# Patient Record
Sex: Female | Born: 1940 | Race: White | Hispanic: No | Smoking: Never smoker
Health system: Southern US, Community
[De-identification: ages and names within clinical notes are randomized; demographics above are authoritative.]

## PROBLEM LIST (undated history)

## (undated) DIAGNOSIS — F0390 Unspecified dementia without behavioral disturbance: Secondary | ICD-10-CM

---

## 2016-09-22 DIAGNOSIS — I2510 Atherosclerotic heart disease of native coronary artery without angina pectoris: Secondary | ICD-10-CM | POA: Diagnosis not present

## 2016-09-22 DIAGNOSIS — I10 Essential (primary) hypertension: Secondary | ICD-10-CM | POA: Diagnosis not present

## 2016-09-22 DIAGNOSIS — E039 Hypothyroidism, unspecified: Secondary | ICD-10-CM | POA: Diagnosis not present

## 2016-09-22 DIAGNOSIS — E871 Hypo-osmolality and hyponatremia: Secondary | ICD-10-CM

## 2016-09-22 DIAGNOSIS — F0390 Unspecified dementia without behavioral disturbance: Secondary | ICD-10-CM | POA: Diagnosis not present

## 2016-09-22 DIAGNOSIS — E785 Hyperlipidemia, unspecified: Secondary | ICD-10-CM

## 2016-09-23 DIAGNOSIS — I10 Essential (primary) hypertension: Secondary | ICD-10-CM | POA: Diagnosis not present

## 2016-09-23 DIAGNOSIS — F0390 Unspecified dementia without behavioral disturbance: Secondary | ICD-10-CM | POA: Diagnosis not present

## 2016-09-23 DIAGNOSIS — E039 Hypothyroidism, unspecified: Secondary | ICD-10-CM | POA: Diagnosis not present

## 2016-09-23 DIAGNOSIS — I2510 Atherosclerotic heart disease of native coronary artery without angina pectoris: Secondary | ICD-10-CM | POA: Diagnosis not present

## 2016-09-24 DIAGNOSIS — I10 Essential (primary) hypertension: Secondary | ICD-10-CM | POA: Diagnosis not present

## 2016-09-24 DIAGNOSIS — E039 Hypothyroidism, unspecified: Secondary | ICD-10-CM | POA: Diagnosis not present

## 2016-09-24 DIAGNOSIS — I2510 Atherosclerotic heart disease of native coronary artery without angina pectoris: Secondary | ICD-10-CM | POA: Diagnosis not present

## 2016-09-24 DIAGNOSIS — F0390 Unspecified dementia without behavioral disturbance: Secondary | ICD-10-CM | POA: Diagnosis not present

## 2018-08-02 DIAGNOSIS — R55 Syncope and collapse: Secondary | ICD-10-CM | POA: Diagnosis not present

## 2018-08-02 DIAGNOSIS — R112 Nausea with vomiting, unspecified: Secondary | ICD-10-CM

## 2018-08-02 DIAGNOSIS — I959 Hypotension, unspecified: Secondary | ICD-10-CM

## 2018-08-02 DIAGNOSIS — E785 Hyperlipidemia, unspecified: Secondary | ICD-10-CM

## 2018-08-02 DIAGNOSIS — E039 Hypothyroidism, unspecified: Secondary | ICD-10-CM

## 2018-08-02 DIAGNOSIS — I2510 Atherosclerotic heart disease of native coronary artery without angina pectoris: Secondary | ICD-10-CM | POA: Diagnosis not present

## 2018-08-02 DIAGNOSIS — F0390 Unspecified dementia without behavioral disturbance: Secondary | ICD-10-CM

## 2018-08-03 DIAGNOSIS — R55 Syncope and collapse: Secondary | ICD-10-CM | POA: Diagnosis not present

## 2018-08-04 DIAGNOSIS — R55 Syncope and collapse: Secondary | ICD-10-CM | POA: Diagnosis not present

## 2018-08-04 DIAGNOSIS — R531 Weakness: Secondary | ICD-10-CM

## 2020-04-14 ENCOUNTER — Ambulatory Visit: Payer: Self-pay | Admitting: Osteopathic Medicine

## 2020-09-25 ENCOUNTER — Encounter (HOSPITAL_COMMUNITY): Payer: Self-pay | Admitting: Emergency Medicine

## 2020-09-25 ENCOUNTER — Emergency Department (HOSPITAL_COMMUNITY)
Admission: EM | Admit: 2020-09-25 | Discharge: 2020-09-26 | Disposition: A | Discharge status: Home / Self Care | Payer: Medicare (Managed Care) | Attending: Emergency Medicine | Admitting: Emergency Medicine

## 2020-09-25 ENCOUNTER — Emergency Department (HOSPITAL_COMMUNITY): Payer: Medicare (Managed Care)

## 2020-09-25 ENCOUNTER — Other Ambulatory Visit: Payer: Self-pay

## 2020-09-25 DIAGNOSIS — F0390 Unspecified dementia without behavioral disturbance: Secondary | ICD-10-CM | POA: Diagnosis not present

## 2020-09-25 DIAGNOSIS — R031 Nonspecific low blood-pressure reading: Secondary | ICD-10-CM | POA: Insufficient documentation

## 2020-09-25 DIAGNOSIS — I951 Orthostatic hypotension: Secondary | ICD-10-CM

## 2020-09-25 DIAGNOSIS — R55 Syncope and collapse: Secondary | ICD-10-CM | POA: Diagnosis present

## 2020-09-25 HISTORY — DX: Unspecified dementia, unspecified severity, without behavioral disturbance, psychotic disturbance, mood disturbance, and anxiety: F03.90

## 2020-09-25 LAB — CBC WITH DIFFERENTIAL/PLATELET
Abs Immature Granulocytes: 0.05 10*3/uL (ref 0.00–0.07)
Basophils Absolute: 0.1 10*3/uL (ref 0.0–0.1)
Basophils Relative: 0 %
Eosinophils Absolute: 0.1 10*3/uL (ref 0.0–0.5)
Eosinophils Relative: 1 %
HCT: 37.4 % (ref 36.0–46.0)
Hemoglobin: 12.3 g/dL (ref 12.0–15.0)
Immature Granulocytes: 0 %
Lymphocytes Relative: 6 %
Lymphs Abs: 0.8 10*3/uL (ref 0.7–4.0)
MCH: 31.6 pg (ref 26.0–34.0)
MCHC: 32.9 g/dL (ref 30.0–36.0)
MCV: 96.1 fL (ref 80.0–100.0)
Monocytes Absolute: 0.9 10*3/uL (ref 0.1–1.0)
Monocytes Relative: 7 %
Neutro Abs: 11.1 10*3/uL — ABNORMAL HIGH (ref 1.7–7.7)
Neutrophils Relative %: 86 %
Platelets: 342 10*3/uL (ref 150–400)
RBC: 3.89 MIL/uL (ref 3.87–5.11)
RDW: 12.9 % (ref 11.5–15.5)
WBC: 13.1 10*3/uL — ABNORMAL HIGH (ref 4.0–10.5)
nRBC: 0 % (ref 0.0–0.2)

## 2020-09-25 LAB — BASIC METABOLIC PANEL
Anion gap: 10 (ref 5–15)
BUN: 12 mg/dL (ref 8–23)
CO2: 21 mmol/L — ABNORMAL LOW (ref 22–32)
Calcium: 8.4 mg/dL — ABNORMAL LOW (ref 8.9–10.3)
Chloride: 101 mmol/L (ref 98–111)
Creatinine, Ser: 1.2 mg/dL — ABNORMAL HIGH (ref 0.44–1.00)
GFR, Estimated: 46 mL/min — ABNORMAL LOW (ref >60)
Glucose, Bld: 142 mg/dL — ABNORMAL HIGH (ref 70–99)
Potassium: 4.1 mmol/L (ref 3.5–5.1)
Sodium: 132 mmol/L — ABNORMAL LOW (ref 135–145)

## 2020-09-25 LAB — TROPONIN I (HIGH SENSITIVITY): Troponin I (High Sensitivity): 4 ng/L (ref <18)

## 2020-09-25 NOTE — ED Provider Notes (Signed)
[facility] EMERGENCY DEPARTMENT Provider Note   CSN: [id number] Arrival date & time: 09/25/20  2204     History Chief Complaint  Patient presents with  . Near Syncope    Faith White Otie Headlee is a 79 y.o. female.  The history is provided by the patient, medical records, the EMS personnel and a relative.    LEVEL V CAVEAT:  DEMENTIA  79 y.o. F presenting to the ED after near syncopal event.  Per EMS, patient was orthostatic with EMS.  Initial BP 80's systolic, dropped into 60's systolic upon standing.  She was given 600cc IVF en route.  Patient without complaints here, states she feels fine.  Denies chest pain, SOB, or recent illness.  Patient not able to answer a lot of questions, replies "I couldn't tell you" to most things.  Spoke with patient's son Rayland who is POA.  Patient also lives with him and wife.  Reportedly tonight was with granddaughter and seemed fine. Patient had gotten out of the shower and went to eat and stood up from table and she almost passed out.  There was no fall to the floor or head trauma as granddaughter was able to catch her.  Patient has had very poor PO intake recently.  Had similar episode about 1 week ago but not as severe.  Initial BP 80/40 when EMS arrived.  No recent changes in BP medications in the past year or so per son.  No fevers or other recent illness.  Did have mild heart attack 10 years ago but no other issues since then.  No past medical history on file.  There are no problems to display for this patient.    OB History   No obstetric history on file.     No family history on file.  Social History   Tobacco Use  . Smoking status: Not on file  Substance Use Topics  . Alcohol use: Not on file  . Drug use: Not on file    Home Medications Prior to Admission medications   Not on File    Allergies    Patient has no allergy information on record.  Review of Systems   Review of Systems  Unable to  perform ROS: Dementia    Physical Exam Updated Vital Signs There were no vitals taken for this visit.  Physical Exam Vitals and nursing note reviewed.  Constitutional:      Appearance: She is well-developed.  HENT:     Head: Normocephalic and atraumatic.  Eyes:     Conjunctiva/sclera: Conjunctivae normal.     Pupils: Pupils are equal, round, and reactive to light.  Cardiovascular:     Rate and Rhythm: Normal rate and regular rhythm.     Heart sounds: Normal heart sounds.  Pulmonary:     Effort: Pulmonary effort is normal.     Breath sounds: Normal breath sounds. No stridor. No wheezing.  Abdominal:     General: Bowel sounds are normal.     Palpations: Abdomen is soft.     Tenderness: There is no abdominal tenderness. There is no rebound.  Musculoskeletal:        General: Normal range of motion.     Cervical back: Normal range of motion.  Skin:    General: Skin is warm and dry.  Neurological:     Mental Status: She is alert and oriented to person, place, and time.     ED Results / Procedures / Treatments  Labs (all labs ordered are listed, but only abnormal results are displayed) Labs Reviewed - No data to display  EKG None  Radiology DG Chest [facility] 1 View  Result Date: 09/25/2020 CLINICAL DATA:  Syncope EXAM: PORTABLE CHEST 1 VIEW COMPARISON:  Radiograph 01/30/2019 FINDINGS: Chronically coarsened interstitial changes and bronchitic features are similar to comparison exams accounting for some mild atelectatic change. No consolidative opacity. No pneumothorax or effusion. Some bandlike areas of scarring are present in the right mid lung. Additional biapical pleuroparenchymal scarring as well. The aorta is calcified. The remaining cardiomediastinal contours are unremarkable. No acute osseous or soft tissue abnormality. Degenerative changes are present in the imaged spine and shoulders. Telemetry leads overlie the chest. IMPRESSION: 1. Chronically coarsened interstitial  changes and bronchitic features are similar to comparison exams accounting for some mild atelectatic change. 2. No other acute cardiopulmonary abnormality. 3.  Aortic Atherosclerosis (ICD10-I70.0). Electronically Signed   By: Kreg Shropshire M.D.   On: 09/25/2020 23:06    Procedures Procedures (including critical care time)  Medications Ordered in ED Medications - No data to display  ED Course  I have reviewed the triage vital signs and the nursing notes.  Pertinent labs & imaging results that were available during my care of the patient were reviewed by me and considered in my medical decision making (see chart for details).    MDM Rules/Calculators/A&P  79 year old female presenting to the ED following near syncopal event. EMS reported + orthostasis at scene. Patient has baseline dementia and is not able to provide much additional history.  She denies any chest pain, shortness of breath, headache, or recent illness.  I did speak with her son whom she lives with and is also POA-- has had poor appetite recently with decreased PO intake.  No recent illness and has otherwise been at baseline.  She is afebrile, non-toxic in appearance here.  HD stable at present after IVF with EMS.  Will obtain screening labs, trop, UA, CXR, and EKG.    Work-up overall reassuring.  Labs without significant electrolyte derangement or acute dehydration.  Troponins negative.  EKG is nonischemic.  Chest x-ray with chronic interstitial changes, no acute infiltrate or other abnormality.  UA without signs of infection.  Orthostatics are appropriate after IVF with EMS.  Myself and RN have ambulated patient, steady gait observed.  Patient remains without complaints.  On chart review, patient with similar visit to [facility] last year with almost identical presentation.  Feel she is stable for discharge home.  Have called son who is POA and discussed findings today, he is also comfortable with plan to discharge home.  Close follow-up  with PCP encouraged-- recommended to try to increase PO intake.  Return here for any new/acute changes.    Final Clinical Impression(s) / ED Diagnoses Final diagnoses:  Near syncope  Orthostatic hypotension    Rx / DC Orders ED Discharge Orders    None       Garlon Hatchet, PA-C 09/26/20 0225    Tegeler, Canary Brim, MD 09/28/20 [phone]

## 2020-09-25 NOTE — ED Triage Notes (Signed)
Pt BIB [facility] from home, family reports witnessed near syncopal event. On standing from the table pt became unstable and her "eyes rolled back", EMS reports family caught pt before she fell. On EMS arrival pt hypotensive with SBP 80s, dropping to 60s on standing. Pt given 600cc Peever pta, pt A&O to baseline, hx dementia.

## 2020-09-26 LAB — URINALYSIS, ROUTINE W REFLEX MICROSCOPIC
Bilirubin Urine: NEGATIVE
Glucose, UA: NEGATIVE mg/dL
Hgb urine dipstick: NEGATIVE
Ketones, ur: NEGATIVE mg/dL
Leukocytes,Ua: NEGATIVE
Nitrite: NEGATIVE
Protein, ur: NEGATIVE mg/dL
Specific Gravity, Urine: 1.008 (ref 1.005–1.030)
pH: 6 (ref 5.0–8.0)

## 2020-09-26 NOTE — Discharge Instructions (Signed)
Please make sure to eat/drink regularly. Follow-up with your primary care doctor. Return here for any new/acute changes.

## 2020-09-27 LAB — URINE CULTURE: Culture: NO GROWTH

## 2020-10-05 ENCOUNTER — Emergency Department (HOSPITAL_COMMUNITY)
Admission: EM | Admit: 2020-10-05 | Discharge: 2020-10-06 | Disposition: A | Discharge status: Home / Self Care | Payer: Medicare (Managed Care) | Attending: Emergency Medicine | Admitting: Emergency Medicine

## 2020-10-05 ENCOUNTER — Encounter (HOSPITAL_COMMUNITY): Payer: Self-pay

## 2020-10-05 ENCOUNTER — Emergency Department (HOSPITAL_COMMUNITY): Payer: Medicare (Managed Care)

## 2020-10-05 DIAGNOSIS — F0391 Unspecified dementia with behavioral disturbance: Secondary | ICD-10-CM | POA: Insufficient documentation

## 2020-10-05 DIAGNOSIS — Z79899 Other long term (current) drug therapy: Secondary | ICD-10-CM | POA: Diagnosis not present

## 2020-10-05 DIAGNOSIS — F69 Unspecified disorder of adult personality and behavior: Secondary | ICD-10-CM

## 2020-10-05 LAB — COMPREHENSIVE METABOLIC PANEL
ALT: 30 U/L (ref 0–44)
AST: 30 U/L (ref 15–41)
Albumin: 4.2 g/dL (ref 3.5–5.0)
Alkaline Phosphatase: 49 U/L (ref 38–126)
Anion gap: 12 (ref 5–15)
BUN: 31 mg/dL — ABNORMAL HIGH (ref 8–23)
CO2: 23 mmol/L (ref 22–32)
Calcium: 9 mg/dL (ref 8.9–10.3)
Chloride: 101 mmol/L (ref 98–111)
Creatinine, Ser: 0.8 mg/dL (ref 0.44–1.00)
GFR, Estimated: >60 mL/min (ref >60)
Glucose, Bld: 129 mg/dL — ABNORMAL HIGH (ref 70–99)
Potassium: 4.1 mmol/L (ref 3.5–5.1)
Sodium: 136 mmol/L (ref 135–145)
Total Bilirubin: 0.4 mg/dL (ref 0.3–1.2)
Total Protein: 7.4 g/dL (ref 6.5–8.1)

## 2020-10-05 LAB — CBC
HCT: 36.9 % (ref 36.0–46.0)
Hemoglobin: 12.2 g/dL (ref 12.0–15.0)
MCH: 31.3 pg (ref 26.0–34.0)
MCHC: 33.1 g/dL (ref 30.0–36.0)
MCV: 94.6 fL (ref 80.0–100.0)
Platelets: 371 10*3/uL (ref 150–400)
RBC: 3.9 MIL/uL (ref 3.87–5.11)
RDW: 13.2 % (ref 11.5–15.5)
WBC: 12.3 10*3/uL — ABNORMAL HIGH (ref 4.0–10.5)
nRBC: 0 % (ref 0.0–0.2)

## 2020-10-05 LAB — ACETAMINOPHEN LEVEL: Acetaminophen (Tylenol), Serum: <10 ug/mL — ABNORMAL LOW (ref 10–30)

## 2020-10-05 LAB — RAPID URINE DRUG SCREEN, HOSP PERFORMED
Amphetamines: NOT DETECTED
Barbiturates: NOT DETECTED
Benzodiazepines: NOT DETECTED
Cocaine: NOT DETECTED
Opiates: NOT DETECTED
Tetrahydrocannabinol: NOT DETECTED

## 2020-10-05 LAB — SALICYLATE LEVEL: Salicylate Lvl: <7 mg/dL — ABNORMAL LOW (ref 7.0–30.0)

## 2020-10-05 LAB — ETHANOL: Alcohol, Ethyl (B): <10 mg/dL (ref <10)

## 2020-10-05 NOTE — ED Provider Notes (Addendum)
[facility]-EMERGENCY DEPT Provider Note   CSN: [id number] Arrival date & time: 10/05/20  2018     History No chief complaint on file.   Faith White is a 79 y.o. female.   Altered Mental Status Presenting symptoms: behavior changes, combativeness and confusion   Severity:  Moderate Most recent episode:  More than 2 days ago Episode history:  Multiple Timing:  Constant Progression:  Worsening Chronicity:  Chronic Context: dementia   Associated symptoms: no fever        Past Medical History:  Diagnosis Date   Dementia (HCC)     There are no problems to display for this patient.   History reviewed. No pertinent surgical history.   OB History   No obstetric history on file.     History reviewed. No pertinent family history.  Social History   Tobacco Use   Smoking status: Never Smoker   Smokeless tobacco: Never Used  Substance Use Topics   Alcohol use: Not Currently   Drug use: Never    Home Medications Prior to Admission medications   Medication Sig Start Date End Date Taking? Authorizing Provider  carvedilol (COREG) 3.125 MG tablet Take 3.125 mg by mouth 2 (two) times daily with a meal.   Yes [provider]  levothyroxine (SYNTHROID) 50 MCG tablet Take 50 mcg by mouth daily before breakfast.   Yes [provider]  lisinopril (ZESTRIL) 5 MG tablet Take 5 mg by mouth daily.   Yes [provider]  melatonin 5 MG TABS Take 5 mg by mouth at bedtime.   Yes [provider]  memantine (NAMENDA) 10 MG tablet Take 10 mg by mouth 2 (two) times daily.   Yes [provider]  metoCLOPramide (REGLAN) 5 MG tablet Take 5 mg by mouth in the morning and at bedtime.   Yes [provider]  pantoprazole (PROTONIX) 40 MG tablet Take 40 mg by mouth daily.   Yes [provider]    Allergies    Patient has no known allergies.  Review of Systems   Review of Systems  Unable  to perform ROS: Dementia  Constitutional: Negative for fever.  Psychiatric/Behavioral: Positive for behavioral problems and confusion.    Physical Exam Updated Vital Signs BP (!) 150/87 (BP Location: Left Arm)   Pulse 93   Temp 97.9 F (36.6 C) (Oral)   Resp 17   SpO2 93%   Physical Exam Vitals and nursing note reviewed. Exam conducted with a chaperone present.  Constitutional:      General: She is not in acute distress.    Appearance: Normal appearance.  HENT:     Head: Normocephalic and atraumatic.     Nose: No rhinorrhea.  Eyes:     General:        Right eye: No discharge.        Left eye: No discharge.     Conjunctiva/sclera: Conjunctivae normal.  Cardiovascular:     Rate and Rhythm: Normal rate and regular rhythm.  Pulmonary:     Effort: Pulmonary effort is normal. No respiratory distress.     Breath sounds: No stridor.  Abdominal:     General: Abdomen is flat. There is no distension.     Palpations: Abdomen is soft.  Musculoskeletal:        General: No tenderness or signs of injury.  Skin:    General: Skin is warm and dry.  Neurological:     General: No focal deficit  present.     Mental Status: She is alert. Mental status is at baseline.     Cranial Nerves: No cranial nerve deficit.     Motor: No weakness.     Gait: Gait normal.  Psychiatric:        Mood and Affect: Mood normal.        Behavior: Behavior is cooperative.        Thought Content: Thought content is not paranoid or delusional. Thought content does not include homicidal or suicidal ideation. Thought content does not include homicidal or suicidal plan.        Cognition and Memory: Cognition is impaired. Memory is impaired.        Judgment: Judgment is impulsive.     ED Results / Procedures / Treatments   Labs (all labs ordered are listed, but only abnormal results are displayed) Labs Reviewed  COMPREHENSIVE METABOLIC PANEL - Abnormal; Notable for the following components:      Result Value     Glucose, Bld 129 (*)    BUN 31 (*)    All other components within normal limits  SALICYLATE LEVEL - Abnormal; Notable for the following components:   Salicylate Lvl <7.0 (*)    All other components within normal limits  ACETAMINOPHEN LEVEL - Abnormal; Notable for the following components:   Acetaminophen (Tylenol), Serum <10 (*)    All other components within normal limits  CBC - Abnormal; Notable for the following components:   WBC 12.3 (*)    All other components within normal limits  ETHANOL  RAPID URINE DRUG SCREEN, HOSP PERFORMED  URINALYSIS, ROUTINE W REFLEX MICROSCOPIC    EKG None  Radiology No results found.  Procedures Procedures (including critical care time)  Medications Ordered in ED Medications - No data to display  ED Course  I have reviewed the triage vital signs and the nursing notes.  Pertinent labs & imaging results that were available during my care of the patient were reviewed by me and considered in my medical decision making (see chart for details).    MDM Rules/Calculators/A&P                          Worsening behavior, hx of dementia, family no longer feels comfortable with behavior and fears for the safety of the pt and other family members. Attempting placement but pt is too violent.  The patient has acute on chronic worsening agitation, possibly in the setting of dementia, will investigate for underlying metabolic or medical cause.  CT head unremarkable for acute change per radiology report, I am unable to view due to computer down time. Labs otherwise unremarkable, urine pending, no focal underlying medical cause found at this time but urine is pending.  Needs a behavioral health assessment to determine if this is underlying psychiatric condition versus worsening chronic medical condition  Needs TTS for eval and possible placement in memory care vs snf.  Final Clinical Impression(s) / ED Diagnoses Final diagnoses:  Behavior problem, adult     Rx / DC Orders ED Discharge Orders     None        Sabino Donovan, MD 10/05/20 2356    Sabino Donovan, MD 10/18/20 2311

## 2020-10-05 NOTE — ED Notes (Signed)
Pt ambulatory to the bathroom with 1 person assist Pt states she is not incontinent

## 2020-10-05 NOTE — ED Triage Notes (Signed)
Police and EMS were called to the house where patient lives with her family because she was doing behaviors that were out of character for her, she has dementia but she's been hurting herself  She punched the wall and has a bruise on her hand and she's been trying to choke herself Family states it's a change in behavior Also EMS reports they have pet rats in cages in the home, several of them and it's very unsanitary

## 2020-10-06 LAB — URINALYSIS, ROUTINE W REFLEX MICROSCOPIC
Bilirubin Urine: NEGATIVE
Glucose, UA: 150 mg/dL — AB
Hgb urine dipstick: NEGATIVE
Ketones, ur: NEGATIVE mg/dL
Leukocytes,Ua: NEGATIVE
Nitrite: NEGATIVE
Protein, ur: NEGATIVE mg/dL
Specific Gravity, Urine: 1.017 (ref 1.005–1.030)
pH: 7 (ref 5.0–8.0)

## 2020-10-06 MED ORDER — LOPERAMIDE HCL 2 MG PO CAPS: 4.0000 mg | ORAL_CAPSULE | Freq: Once | ORAL | Status: DC

## 2020-10-06 NOTE — ED Notes (Signed)
[phone] [place], patients social worker outside of hospital

## 2020-10-06 NOTE — ED Notes (Signed)
Patient family upset with patient coming back home. Situation explained to the family. Also called SW and had sw to call and talk to family.

## 2020-10-06 NOTE — ED Provider Notes (Signed)
Emergency Medicine Observation Re-evaluation Note  Faith White is a 79 y.o. female, seen on rounds today.  Pt initially presented to the ED for complaints of No chief complaint on file. Currently, the patient is in hallway with sitter.  Physical Exam  BP (!) 155/96   Pulse 90   Temp 98.9 F (37.2 C) (Oral)   Resp 16   SpO2 95%  Physical Exam General: alert Cardiac: rrr Lungs: cta Psych: redirectable  ED Course / MDM  EKG:    I have reviewed the labs performed to date as well as medications administered while in observation.  Recent changes in the last 24 hours include social work.  Plan  Current plan is for placement. Patient is not under full IVC at this time.   Terrilee Files, MD 10/07/20 [phone]

## 2020-10-06 NOTE — ED Notes (Signed)
Small BM, not enough to send to lab.

## 2020-10-06 NOTE — Progress Notes (Signed)
Consult request has been received. CSW attempting to follow up at present time.  Per the RN pt is ambulatory and is A&O and is stating she wants to go home.  EDP updated that pt is psyche cleared and sees no reason pt cannot be D/C;d and requests CSW contact family and update them pt is ready for D/C.  CSW will continue to follow for D/C needs.  Dorothe Pea. Zaim Nitta  MSW, LCSW, LCAS, CSI Transitions of Care Clinical Social Worker Care Coordination Department Ph: [phone]

## 2020-10-06 NOTE — [facility] Counselor (Signed)
Disposition: Per Berneice Heinrich, FNP this patient does not meet in patient care criteria. Behaviors likely secondary to dementia diagnosis. Consult to social work placed. Jodene Coffin, MA notified.

## 2020-10-06 NOTE — ED Notes (Signed)
Call received from pt caretaker Hollace Wolpert@ [phone] requesting rtn call for pt status/updates. [employer]
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
## 2020-10-06 NOTE — ED Notes (Signed)
Per patient she is having diarrhea

## 2020-10-06 NOTE — BH Assessment (Signed)
Patient currently in hallway, we request patient be brought to a room due to HIPPA.

## 2020-10-06 NOTE — Progress Notes (Signed)
CSW spoke with the EDP/CN and the EDP states if a safe environment is available for the pt to return to the EDP would be agreeable per the CN.  EDP states the pt going to a safe environment is key to the pt returning home and per the pt's son the pt lives with her daughter who cares for her.    EDP/CN aware.  CSW updated pt's son pt is D/C'ing and [facility] would transport the pt home so the pt's son would not have to p/u the pt and the pt's son voiced understanding.  CN/RN updated.  Please reconsult if future social work needs arise.  CSW signing off, as social work intervention is no longer needed.  Faith White. Faith White  MSW, LCSW, LCAS, CCS Transitions of Care Clinical Social Worker Care Coordination Department Ph: [phone]

## 2020-10-06 NOTE — BH Assessment (Signed)
[facility] Assessment Progress Note  Per Berneice Heinrich, NP, this voluntary pt does not require psychiatric hospitalization at this time.  Pt is psychiatrically cleared.  No behavioral health referrals are indicated for pt.  A TOC consult has been ordered to address pt's psychosocial needs.  EDP Meridee Score, MD and pt's nurse, Morrie Sheldon, have been notified.  Doylene Canning, MA Triage Specialist [phone]

## 2020-10-06 NOTE — Progress Notes (Addendum)
CSW was able to get in touch with the pt's son who states he has been in touch with [facility] tonight upon hearing that the pt is up for D/C and states that he and the staff at [facility] are working on getting the pt into a respite bed first thing in the morning with the help of [facility].  Per pt's son, the  pt is unsafe in her home with just her daughter with whom the pt lives and that prior tp coming into the hospital the pt had purposefully forced herself to "throw up" onto the dinner table prior to their evening meal starting after the pt had been awakened to eat with the family.  Pt's son states pt lives with her daughter who cares for the pt and with whom the pt has lived for awhile now and this is where she would return to if discharged.  Per pt's son the pt has been hoarding food, per the pt's daughter, and has not been eating as they thought she had been upon inspection of her room.  CSW will continue to follow for D/C needs.  Faith White  MSW, LCSW, LCAS, CCS Transitions of Care Clinical Social Worker Care Coordination Department Ph: [phone]

## 2020-10-06 NOTE — ED Notes (Signed)
social worker of community called and said that family is wanting [facility] unit and bed is available but with new behaviors unaware if she is still available. [facility] can also be choice.

## 2020-10-06 NOTE — Progress Notes (Signed)
CSW spoke to pt's RN who stated she would update pt's family pt is ready for D/C.  CSW standing by should additional social work needs arise.  CSW will continue to follow for D/C needs.  Dorothe Pea. Kayona Foor  MSW, LCSW, LCAS, CCS Transitions of Care Clinical Social Worker Care Coordination Department Ph: [phone]

## 2020-10-06 NOTE — ED Provider Notes (Signed)
Patient cleared by psychiatry for discharge. Family willing to care for her at home. Plan discharge.    Pollyann Savoy, MD 10/06/20 2009

## 2020-10-06 NOTE — ED Notes (Addendum)
"  Pt is psych cleared. Doesn't need behavioral health referrals, so none in AVS. TOC consult ordered to arrange for pt's next step after ED" per [facility], counsler.

## 2020-10-10 ENCOUNTER — Emergency Department (HOSPITAL_COMMUNITY): Payer: Medicare (Managed Care)

## 2020-10-10 ENCOUNTER — Emergency Department (HOSPITAL_COMMUNITY)
Admission: EM | Admit: 2020-10-10 | Discharge: 2020-10-11 | Disposition: A | Discharge status: Home / Self Care | Payer: Medicare (Managed Care) | Attending: Emergency Medicine | Admitting: Emergency Medicine

## 2020-10-10 ENCOUNTER — Other Ambulatory Visit: Payer: Self-pay

## 2020-10-10 DIAGNOSIS — F0390 Unspecified dementia without behavioral disturbance: Secondary | ICD-10-CM | POA: Diagnosis not present

## 2020-10-10 DIAGNOSIS — R2681 Unsteadiness on feet: Secondary | ICD-10-CM | POA: Diagnosis not present

## 2020-10-10 DIAGNOSIS — R410 Disorientation, unspecified: Secondary | ICD-10-CM | POA: Insufficient documentation

## 2020-10-10 DIAGNOSIS — R262 Difficulty in walking, not elsewhere classified: Secondary | ICD-10-CM | POA: Diagnosis not present

## 2020-10-10 DIAGNOSIS — M6281 Muscle weakness (generalized): Secondary | ICD-10-CM | POA: Insufficient documentation

## 2020-10-10 DIAGNOSIS — R627 Adult failure to thrive: Secondary | ICD-10-CM | POA: Insufficient documentation

## 2020-10-10 DIAGNOSIS — Z20822 Contact with and (suspected) exposure to covid-19: Secondary | ICD-10-CM | POA: Diagnosis not present

## 2020-10-10 LAB — COMPREHENSIVE METABOLIC PANEL
ALT: 26 U/L (ref 0–44)
AST: 23 U/L (ref 15–41)
Albumin: 3.4 g/dL — ABNORMAL LOW (ref 3.5–5.0)
Alkaline Phosphatase: 44 U/L (ref 38–126)
Anion gap: 11 (ref 5–15)
BUN: 21 mg/dL (ref 8–23)
CO2: 24 mmol/L (ref 22–32)
Calcium: 9 mg/dL (ref 8.9–10.3)
Chloride: 102 mmol/L (ref 98–111)
Creatinine, Ser: 0.96 mg/dL (ref 0.44–1.00)
GFR, Estimated: >60 mL/min (ref >60)
Glucose, Bld: 100 mg/dL — ABNORMAL HIGH (ref 70–99)
Potassium: 3.9 mmol/L (ref 3.5–5.1)
Sodium: 137 mmol/L (ref 135–145)
Total Bilirubin: 0.8 mg/dL (ref 0.3–1.2)
Total Protein: 6.2 g/dL — ABNORMAL LOW (ref 6.5–8.1)

## 2020-10-10 LAB — CBC WITH DIFFERENTIAL/PLATELET
Abs Immature Granulocytes: 0.03 10*3/uL (ref 0.00–0.07)
Basophils Absolute: 0 10*3/uL (ref 0.0–0.1)
Basophils Relative: 1 %
Eosinophils Absolute: 0.2 10*3/uL (ref 0.0–0.5)
Eosinophils Relative: 2 %
HCT: 35 % — ABNORMAL LOW (ref 36.0–46.0)
Hemoglobin: 11.4 g/dL — ABNORMAL LOW (ref 12.0–15.0)
Immature Granulocytes: 0 %
Lymphocytes Relative: 20 %
Lymphs Abs: 1.8 10*3/uL (ref 0.7–4.0)
MCH: 31.2 pg (ref 26.0–34.0)
MCHC: 32.6 g/dL (ref 30.0–36.0)
MCV: 95.9 fL (ref 80.0–100.0)
Monocytes Absolute: 0.6 10*3/uL (ref 0.1–1.0)
Monocytes Relative: 7 %
Neutro Abs: 6.1 10*3/uL (ref 1.7–7.7)
Neutrophils Relative %: 70 %
Platelets: 362 10*3/uL (ref 150–400)
RBC: 3.65 MIL/uL — ABNORMAL LOW (ref 3.87–5.11)
RDW: 13.2 % (ref 11.5–15.5)
WBC: 8.7 10*3/uL (ref 4.0–10.5)
nRBC: 0 % (ref 0.0–0.2)

## 2020-10-10 MED ORDER — SODIUM CHLORIDE 0.9 % IV BOLUS
500.0000 mL | Freq: Once | INTRAVENOUS | Status: AC
  Administered 2020-10-10: 500 mL via INTRAVENOUS

## 2020-10-10 MED ORDER — MELATONIN 5 MG PO TABS
5.0000 mg | ORAL_TABLET | Freq: Every day | ORAL | Status: DC
  Administered 2020-10-11: 5 mg via ORAL
  Filled 2020-10-10 (×2): qty 1

## 2020-10-10 MED ORDER — LEVOTHYROXINE SODIUM 50 MCG PO TABS
50.0000 ug | ORAL_TABLET | Freq: Every day | ORAL | Status: DC
  Administered 2020-10-11: 50 ug via ORAL
  Filled 2020-10-10: qty 1

## 2020-10-10 MED ORDER — MEMANTINE HCL 10 MG PO TABS
10.0000 mg | ORAL_TABLET | Freq: Two times a day (BID) | ORAL | Status: DC
  Administered 2020-10-11 (×2): 10 mg via ORAL
  Filled 2020-10-10 (×3): qty 1

## 2020-10-10 MED ORDER — PANTOPRAZOLE SODIUM 40 MG PO TBEC
40.0000 mg | DELAYED_RELEASE_TABLET | Freq: Every day | ORAL | Status: DC
  Administered 2020-10-11: 40 mg via ORAL
  Filled 2020-10-10: qty 1

## 2020-10-10 MED ORDER — LISINOPRIL 10 MG PO TABS
5.0000 mg | ORAL_TABLET | Freq: Every day | ORAL | Status: DC
  Administered 2020-10-11: 5 mg via ORAL
  Filled 2020-10-10: qty 1

## 2020-10-10 MED ORDER — CARVEDILOL 3.125 MG PO TABS
3.1250 mg | ORAL_TABLET | Freq: Two times a day (BID) | ORAL | Status: DC
  Administered 2020-10-11: 3.125 mg via ORAL
  Filled 2020-10-10 (×2): qty 1

## 2020-10-10 NOTE — ED Notes (Signed)
Some white discharge noted when cleaning pt for [facility] placement

## 2020-10-10 NOTE — ED Provider Notes (Signed)
[facility] EMERGENCY DEPARTMENT Provider Note  CSN: [id number] Arrival date & time: 10/10/20 1821    History Chief Complaint  Patient presents with  . Failure To Thrive    HPI  Faith White is a 79 y.o. female brought to the ED from home via EMS. She denies any current complaints. She lives with son who reports she was recently at [facility] for some behavioral issues including not eating or drinking well. Eventually cleared medically and psychiatrically and discharged back home with plan for outpatient management and efforts towards getting her into a LTCF. She is a patient of [facility]. Son reports she has been getting worse since being home, has not eaten or drank much in the last several days. He has spoke with [facility] several times but reports they have not been responsive or helpful. A nurse from an ALF came to the the house to assess her today and told them that she did not meet their criteria for admission, only had 1-2 days left to live and needed to be in Hospice.    Past Medical History:  Diagnosis Date  . Dementia (HCC)     No past surgical history on file.  No family history on file.  Social History   Tobacco Use  . Smoking status: Never Smoker  . Smokeless tobacco: Never Used  Substance Use Topics  . Alcohol use: Not Currently  . Drug use: Never     Home Medications Prior to Admission medications   Medication Sig Start Date End Date Taking? Authorizing Provider  carvedilol (COREG) 3.125 MG tablet Take 3.125 mg by mouth 2 (two) times daily with a meal.    [provider]  levothyroxine (SYNTHROID) 50 MCG tablet Take 50 mcg by mouth daily before breakfast.    [provider]  lisinopril (ZESTRIL) 5 MG tablet Take 5 mg by mouth daily.    [provider]  melatonin 5 MG TABS Take 5 mg by mouth at bedtime.    [provider]  memantine (NAMENDA) 10 MG tablet Take 10 mg by mouth 2 (two) times daily.    [provider]  metoCLOPramide (REGLAN) 5 MG tablet Take 5 mg by mouth in the morning and at bedtime.    [provider]  pantoprazole (PROTONIX) 40 MG tablet Take 40 mg by mouth daily.    [provider]     Allergies    Patient has no known allergies.   Review of Systems   Review of Systems Unable to assess due to mental status.    Physical Exam BP (!) 149/70 (BP Location: Right Arm)   Pulse 64   Temp 98.5 F (36.9 C) (Oral)   Resp (!) 21   SpO2 98% Comment: 2L  Physical Exam Vitals and nursing note reviewed.  Constitutional:      Appearance: Normal appearance.     Comments: Sleeping, but arouses to verbal stimuli  HENT:     Head: Normocephalic and atraumatic.     Nose: Nose normal.     Mouth/Throat:     Mouth: Mucous membranes are dry.  Eyes:     Extraocular Movements: Extraocular movements intact.     Conjunctiva/sclera: Conjunctivae normal.  Cardiovascular:     Rate and Rhythm: Normal rate.  Pulmonary:     Effort: Pulmonary effort is normal.     Breath sounds: Normal breath sounds.  Abdominal:     General: Abdomen is flat.     Palpations: Abdomen  is soft.     Tenderness: There is no abdominal tenderness.  Musculoskeletal:        General: No swelling. Normal range of motion.     Cervical back: Neck supple.  Skin:    General: Skin is warm and dry.  Neurological:     General: No focal deficit present.     Mental Status: She is disoriented.  Psychiatric:        Mood and Affect: Mood normal.      ED Results / Procedures / Treatments   Labs (all labs ordered are listed, but only abnormal results are displayed) Labs Reviewed - No data to display  EKG None  Radiology No results found.  Procedures Procedures  Medications Ordered in the ED Medications - No data to display   MDM Rules/Calculators/A&P MDM Patient with worsening status from home, son told she only has 1-2 days left and recommended Hospice. Family has not gotten much help  from [facility] and were not ready for take care of her at end of life. She is able to speak with clear speech, although she is disoriented. Son states she has been talking to family members who are deceased the last few days. She denies any complaints. Initial vitals with mild hypoxia on arrival, improved with [place]. Will recheck labs and CXR from recent visit at [facility] and dispo accordingly.  ED Course  I have reviewed the triage vital signs and the nursing notes.  Pertinent labs & imaging results that were available during my care of the patient were reviewed by me and considered in my medical decision making (see chart for details).  Clinical Course as of Oct 10 2353  Mon Oct 10, 2020  2215 CBC is unchanged.    [CS]  2319 CMP without acute changes. Patient does not have an apparent medical reason for admission. I had a long conversation with the patient's family who are very frustrated with the lack of options available for the patient. She was rejected by ALF today and told she needs hospice care which I am not sure is the case. Regardless, the family has reached a point where they are no longer able to adequately care for her. She is not eating or drinking and unable to assist with ADLs to the level she was previously. I do not feel that it is safe for her to go back home at this point and so we will hold her in the ED overnight to be re-evaluated by SW/CM for SNF placement. Likely will also need PT eval in the AM. For now she appears comfortable, has not been very alert but does respond to voice. She has not had any urine output since arrival, so will give a small fluid bolus.    [CS]    Clinical Course User Index [CS] Pollyann Savoy, MD    Final Clinical Impression(s) / ED Diagnoses Final diagnoses:  None    Rx / DC Orders ED Discharge Orders    None       Pollyann Savoy, MD 10/10/20 2355

## 2020-10-10 NOTE — ED Notes (Signed)
Family states pt does not produce urine very often and would like to avoid I&O cath at this time.

## 2020-10-11 LAB — URINALYSIS, ROUTINE W REFLEX MICROSCOPIC
Bilirubin Urine: NEGATIVE
Glucose, UA: NEGATIVE mg/dL
Hgb urine dipstick: NEGATIVE
Ketones, ur: 5 mg/dL — AB
Nitrite: NEGATIVE
Protein, ur: NEGATIVE mg/dL
Specific Gravity, Urine: 1.018 (ref 1.005–1.030)
pH: 5 (ref 5.0–8.0)

## 2020-10-11 LAB — RESPIRATORY PANEL BY RT PCR (FLU A&B, COVID)
Influenza A by PCR: NEGATIVE
Influenza B by PCR: NEGATIVE
SARS Coronavirus 2 by RT PCR: NEGATIVE

## 2020-10-11 MED ORDER — CEPHALEXIN 250 MG PO CAPS
500.0000 mg | ORAL_CAPSULE | Freq: Two times a day (BID) | ORAL | Status: DC
  Administered 2020-10-11: 500 mg via ORAL
  Filled 2020-10-11: qty 2

## 2020-10-11 NOTE — ED Provider Notes (Signed)
Emergency Medicine Observation Re-evaluation Note  Faith White is a 79 y.o. female, seen on rounds today.  Pt initially presented to the ED for complaints of Failure To Thrive Currently, the patient is awaiting placement.  Physical Exam  BP (!) 160/77   Pulse 69   Temp 98 F (36.7 C) (Oral)   Resp 19   SpO2 93%  Physical Exam General: resting in stretcher, no acute distress Cardiac: normal HR Lungs: no increased WOB Psych: no acute psychosis  ED Course / MDM  EKG:  Clinical Course as of Oct 11 904  [facility] Oct 10, 2020  2215 CBC is unchanged.    [CS]  2319 CMP without acute changes. Patient does not have an apparent medical reason for admission. I had a long conversation with the patient's family who are very frustrated with the lack of options available for the patient. She was rejected by ALF today and told she needs hospice care which I am not sure is the case. Regardless, the family has reached a point where they are no longer able to adequately care for her. She is not eating or drinking and unable to assist with ADLs to the level she was previously. I do not feel that it is safe for her to go back home at this point and so we will hold her in the ED overnight to be re-evaluated by SW/CM for SNF placement. Likely will also need PT eval in the AM. For now she appears comfortable, has not been very alert but does respond to voice. She has not had any urine output since arrival, so will give a small fluid bolus.    [CS]    Clinical Course User Index [CS] Pollyann Savoy, MD   I have reviewed the labs performed to date as well as medications administered while in observation.  Recent changes in the last 24 hours include home meds being ordered.  Plan  Current plan is for placement. Patient is not under full IVC at this time.   Pricilla Loveless, MD 10/11/20 [phone]

## 2020-10-11 NOTE — NC FL2 (Signed)
[place] MEDICAID FL2 LEVEL OF CARE SCREENING TOOL     IDENTIFICATION  Patient Name: Faith White Birthdate: [birth year] Sex: female Admission Date (Current Location): 10/10/2020  [facility] and [place] Number:  Producer, television/film/video and Address:  [facility], [address], [place], [place] [address]      Provider Number: [id number]  Attending Physician Name and Address:  Default, Provider, MD  Relative Name and Phone Number:       Current Level of Care: Hospital Recommended Level of Care: Skilled Nursing Facility Prior Approval Number:    Date Approved/Denied:   PASRR Number: [id number]  Discharge Plan: Home    Current Diagnoses: There are no problems to display for this patient.   Orientation RESPIRATION BLADDER Height & Weight     Self  O2 (2L) Incontinent, Indwelling catheter Weight:   Height:     BEHAVIORAL SYMPTOMS/MOOD NEUROLOGICAL BOWEL NUTRITION STATUS      Continent    AMBULATORY STATUS COMMUNICATION OF NEEDS Skin   Extensive Assist Verbally Normal                       Personal Care Assistance Level of Assistance  Bathing, Dressing Bathing Assistance: Limited assistance   Dressing Assistance: Limited assistance     Functional Limitations Info  Sight, Hearing, Speech Sight Info: Adequate Hearing Info: Adequate Speech Info: Adequate    SPECIAL CARE FACTORS FREQUENCY  PT (By licensed PT), OT (By licensed OT)     PT Frequency: 5x weekly OT Frequency: 5x weekly            Contractures Contractures Info: Not present    Additional Factors Info                  Current Medications (10/11/2020):  This is the current hospital active medication list Current Facility-Administered Medications  Medication Dose Route Frequency Provider Last Rate Last Admin  . carvedilol (COREG) tablet 3.125 mg  3.125 mg Oral BID WC Pollyann Savoy, MD   3.125 mg at 10/11/20 0959  . cephALEXin (KEFLEX) capsule  500 mg  500 mg Oral Q12H Pricilla Loveless, MD   500 mg at 10/11/20 1246  . levothyroxine (SYNTHROID) tablet 50 mcg  50 mcg Oral QAC breakfast Pollyann Savoy, MD   50 mcg at 10/11/20 1002  . lisinopril (ZESTRIL) tablet 5 mg  5 mg Oral Daily Pollyann Savoy, MD   5 mg at 10/11/20 1003  . melatonin tablet 5 mg  5 mg Oral QHS Pollyann Savoy, MD   5 mg at 10/11/20 0019  . memantine (NAMENDA) tablet 10 mg  10 mg Oral BID Pollyann Savoy, MD   10 mg at 10/11/20 1003  . pantoprazole (PROTONIX) EC tablet 40 mg  40 mg Oral Daily Pollyann Savoy, MD   40 mg at 10/11/20 1008   Current Outpatient Medications  Medication Sig Dispense Refill  . carvedilol (COREG) 3.125 MG tablet Take 3.125 mg by mouth 2 (two) times daily with a meal.    . levothyroxine (SYNTHROID) 50 MCG tablet Take 50 mcg by mouth daily before breakfast.    . lisinopril (ZESTRIL) 5 MG tablet Take 5 mg by mouth daily.    . melatonin 5 MG TABS Take 5 mg by mouth at bedtime.    . memantine (NAMENDA) 10 MG tablet Take 10 mg by mouth 2 (two) times daily.    . metoCLOPramide (REGLAN) 5  MG tablet Take 5 mg by mouth in the morning and at bedtime.    . pantoprazole (PROTONIX) 40 MG tablet Take 40 mg by mouth daily.       Discharge Medications: Please see discharge summary for a list of discharge medications.  Relevant Imaging Results:  Relevant Lab Results:   Additional Information SSN: [id number]  Inis Sizer, LCSW

## 2020-10-11 NOTE — ED Notes (Signed)
Lunch Tray Ordered @ 1701. 

## 2020-10-11 NOTE — ED Notes (Signed)
Correction:Dinner Tray Ordered @ 1701. 

## 2020-10-11 NOTE — [place] FL2 (Signed)
  [place] MEDICAID FL2 LEVEL OF CARE SCREENING TOOL     IDENTIFICATION  Patient Name: Faith White Birthdate: July 28, 1941 Sex: female Admission Date (Current Location): 10/10/2020  [facility] and [place] Number:  Producer, television/film/video and Address:  [facility] [place], [address] [place], [place] [address]      Provider Number: [id number]  Attending Physician Name and Address:  Default, Provider, MD  Relative Name and Phone Number:       Current Level of Care: Hospital Recommended Level of Care: Skilled Nursing Facility Prior Approval Number:    Date Approved/Denied:   PASRR Number: [id number]  Discharge Plan: Other (Comment) (Respite)    Current Diagnoses: There are no problems to display for this patient.   Orientation RESPIRATION BLADDER Height & Weight     Self  O2 (2L) Indwelling catheter, Incontinent Weight:   Height:     BEHAVIORAL SYMPTOMS/MOOD NEUROLOGICAL BOWEL NUTRITION STATUS      Continent    AMBULATORY STATUS COMMUNICATION OF NEEDS Skin   Extensive Assist Verbally Normal                       Personal Care Assistance Level of Assistance  Bathing, Feeding, Dressing Bathing Assistance: Limited assistance Feeding assistance: Limited assistance Dressing Assistance: Limited assistance     Functional Limitations Info  Sight, Hearing, Speech Sight Info: Adequate Hearing Info: Adequate Speech Info: Adequate    SPECIAL CARE FACTORS FREQUENCY  PT (By licensed PT), OT (By licensed OT)     PT Frequency: 5x weekly OT Frequency: 5x weekly            Contractures Contractures Info: Not present    Additional Factors Info                  Current Medications (10/11/2020):  This is the current hospital active medication list Current Facility-Administered Medications  Medication Dose Route Frequency Provider Last Rate Last Admin  . cephALEXin (KEFLEX) capsule 500 mg  500 mg Oral Q12H Pricilla Loveless, MD    500 mg at 10/11/20 1246  . levothyroxine (SYNTHROID) tablet 50 mcg  50 mcg Oral QAC breakfast Pollyann Savoy, MD   50 mcg at 10/11/20 1002   Current Outpatient Medications  Medication Sig Dispense Refill  . Ensure Plus (ENSURE PLUS) LIQD Take 237 mLs by mouth 2 (two) times daily between meals.    Marland Kitchen levothyroxine (SYNTHROID) 50 MCG tablet Take 50 mcg by mouth daily before breakfast.    . carvedilol (COREG) 3.125 MG tablet Take 3.125 mg by mouth 2 (two) times daily with a meal. (Patient not taking: Reported on 10/11/2020)    . lisinopril (ZESTRIL) 5 MG tablet Take 5 mg by mouth daily. (Patient not taking: Reported on 10/11/2020)    . melatonin 5 MG TABS Take 5 mg by mouth at bedtime. (Patient not taking: Reported on 10/11/2020)    . memantine (NAMENDA) 10 MG tablet Take 10 mg by mouth 2 (two) times daily. (Patient not taking: Reported on 10/11/2020)    . pantoprazole (PROTONIX) 40 MG tablet Take 40 mg by mouth daily. (Patient not taking: Reported on 10/11/2020)       Discharge Medications: Please see discharge summary for a list of discharge medications.  Relevant Imaging Results:  Relevant Lab Results:   Additional Information SSN: [id number]  Shella Spearing, LCSW

## 2020-10-11 NOTE — ED Notes (Signed)
Lunch Tray Ordered @ 1003. 

## 2020-10-11 NOTE — Discharge Planning (Signed)
RNCM received active medications list  From [facility] Records/Clinical Administrative Assistant, Storm Frisk to update pt chart.  RNCM provided list to Star Age, Pharmacy Tech.

## 2020-10-11 NOTE — Progress Notes (Signed)
Pt was seen for mobiltiy with gait and transfers assessed, and demonstrates safety concerns with balance and independence of her mobility.  Pt is unable to demonstrate protective ext, has weakness on LE's and requires further assessment for AD as she is unable to safely walk alone.  Follow acutely for work on safe transfers, static and dynamic balance and to get further history as is available.    10/11/20 1600  PT Visit Information  Last PT Received On 10/11/20  Assistance Needed +1  History of Present Illness 79 yo female with onset of hypoxia and decline of function over a few weeks was brought to ED.  Pt has been a [facility] pt, pt unable to give history.  Pt is noting difficulty urinating but feels continually she needs to go. PMHx:  dementia, [place],   Precautions  Precautions Fall  Precaution Comments anxiety about standing  Restrictions  Weight Bearing Restrictions No  Home Living  Family/patient expects to be discharged to: Private residence  Living Arrangements Children;Other relatives  Available Help at Discharge Family;Personal care attendant  Type of Home House  Home Equipment None  Additional Comments pt cannot give any history  Prior Function  Level of Independence Needs assistance  Gait / Transfers Assistance Needed pt thinks she walked with no AD  ADL's / Homemaking Assistance Needed home with family to help  Communication  Communication No difficulties  Pain Assessment  Pain Assessment No/denies pain  Cognition  Arousal/Alertness Awake/alert  Behavior During Therapy Anxious;Impulsive  Overall Cognitive Status History of cognitive impairments - at baseline  General Comments history of dementia  Upper Extremity Assessment  Upper Extremity Assessment Generalized weakness  Lower Extremity Assessment  Lower Extremity Assessment Generalized weakness  Cervical / Trunk Assessment  Cervical / Trunk Assessment Kyphotic  Bed Mobility  Overal bed mobility Needs Assistance   Bed Mobility Supine to Sit;Sit to Supine  Supine to sit Min assist  Sit to supine Min assist  General bed mobility comments assist to lift trunk off bed and lift legs to return to bed  Transfers  Overall transfer level Needs assistance  Equipment used 1 person hand held assist  Transfers Sit to/from Stand  Sit to Stand Min assist  General transfer comment min assist to support and avoid loss of balance  Ambulation/Gait  Ambulation/Gait assistance Min assist  Gait Distance (Feet) 5 Feet  Assistive device 1 person hand held assist  Gait Pattern/deviations Step-to pattern;Decreased stride length;Wide base of support  General Gait Details pt is fearful to step so worked on side of bed with sidesteps, min assist and cues for steps, reluctant  Gait velocity reduced  Gait velocity interpretation <1.8 ft/sec, indicate of risk for recurrent falls  Balance  Overall balance assessment Needs assistance  Sitting-balance support Feet supported;Bilateral upper extremity supported  Sitting balance-Leahy Scale Fair  Standing balance support Bilateral upper extremity supported;During functional activity  Standing balance-Leahy Scale Poor  General Comments  General comments (skin integrity, edema, etc.) pt requires minimal help to stand and step but is hindered by her feeling of being unsteady.  Pt is reporting need to urinate and cannot, PT expressed concern of UTI  Exercises  Exercises Other exercises (strength in LE's is 4- throughout)  PT - End of Session  Equipment Utilized During Treatment Gait belt;Oxygen  Activity Tolerance Patient limited by fatigue;Other (comment) (anxiety and AMS)  Patient left in bed;with call bell/phone within reach;with nursing/sitter in room  Nurse Communication Mobility status  PT Assessment  PT Recommendation/Assessment  Patient needs continued PT services  PT Visit Diagnosis Unsteadiness on feet (R26.81);Muscle weakness (generalized) (M62.81);Difficulty in  walking, not elsewhere classified (R26.2);Adult, failure to thrive (R62.7)  PT Problem List Decreased strength;Decreased range of motion;Decreased activity tolerance;Decreased balance;Decreased mobility;Decreased coordination;Decreased cognition;Decreased knowledge of use of DME;Decreased safety awareness;Cardiopulmonary status limiting activity  Barriers to Discharge Decreased caregiver support  Barriers to Discharge Comments home with family but needs rehab  PT Plan  PT Frequency (ACUTE ONLY) Min 3X/week  PT Treatment/Interventions (ACUTE ONLY) DME instruction;Gait training;Functional mobility training;Therapeutic activities;Therapeutic exercise;Balance training;Neuromuscular re-education;Patient/family education  AM-PAC PT "6 Clicks" Mobility Outcome Measure (Version 2)  Help needed turning from your back to your side while in a flat bed without using bedrails? 3  Help needed moving from lying on your back to sitting on the side of a flat bed without using bedrails? 3  Help needed moving to and from a bed to a chair (including a wheelchair)? 3  Help needed standing up from a chair using your arms (e.g., wheelchair or bedside chair)? 3  Help needed to walk in hospital room? 3  Help needed climbing 3-5 steps with a railing?  1  6 Click Score 16  Consider Recommendation of Discharge To: Home with [facility]  PT Recommendation  Follow Up Recommendations SNF  PT equipment None recommended by PT  Individuals Consulted  Consulted and Agree with Results and Recommendations Patient unable/family or caregiver not available  Acute Rehab PT Goals  Patient Stated Goal to be able to urinate  PT Goal Formulation With patient  Time For Goal Achievement 10/25/20  Potential to Achieve Goals Good  PT Time Calculation  PT Start Time (ACUTE ONLY) 1048  PT Stop Time (ACUTE ONLY) 1102  PT Time Calculation (min) (ACUTE ONLY) 14 min  PT General Charges  $$ ACUTE PT VISIT 1 Visit  PT Evaluation  $PT Eval Moderate  Complexity 1 Mod  Written Expression  Dominant Hand  (unable to get information)    Samul Dada, PT MS Acute Rehab Dept. Number: [facility] [id number] and [facility] [phone]

## 2020-10-11 NOTE — ED Provider Notes (Signed)
Patient has not urinated and bladder scan shows over 800 cc in her bladder.  She was able to get up to bedside commode and get out over 300 but still had over 500 left.  Foley catheter will be placed.  Urinalysis shows large leukocytes and in the setting of weakness will give Keflex and sent for culture.    Pricilla Loveless, MD 10/11/20 1239

## 2020-10-11 NOTE — Progress Notes (Addendum)
1:45pm: CSW received call from [facility] in admissions at [facility] to review this patient's information for possible admission.  CSW will complete patient's FL2 and wait for PT note.  9am: CSW spoke with Jill Side, the [facility] social worker regarding this patient. Jill Side reports a very rapid decline for the patient in the last few months. Colleen reports [facility] was seeking emergency respite but was unable to locate a bed. Jill Side states [facility] has contracts with [employer] and [facility]. Jill Side confirmed that the patient was denied at [facility].  CSW to follow for discharge planning.    Edwin Dada, MSW, LCSW-A Transitions of Care  Clinical Social Worker  [facility] Emergency Departments  Medical ICU [phone]

## 2020-10-12 LAB — URINE CULTURE

## 2021-01-10 ENCOUNTER — Emergency Department (HOSPITAL_COMMUNITY)
Admission: EM | Admit: 2021-01-10 | Discharge: 2021-01-11 | Disposition: A | Discharge status: Home / Self Care | Payer: Medicare Other | Attending: Emergency Medicine | Admitting: Emergency Medicine

## 2021-01-10 ENCOUNTER — Encounter (HOSPITAL_COMMUNITY): Payer: Self-pay | Admitting: Emergency Medicine

## 2021-01-10 ENCOUNTER — Other Ambulatory Visit: Payer: Self-pay

## 2021-01-10 DIAGNOSIS — F0390 Unspecified dementia without behavioral disturbance: Secondary | ICD-10-CM | POA: Diagnosis not present

## 2021-01-10 DIAGNOSIS — R350 Frequency of micturition: Secondary | ICD-10-CM | POA: Diagnosis present

## 2021-01-10 DIAGNOSIS — R300 Dysuria: Secondary | ICD-10-CM

## 2021-01-10 DIAGNOSIS — Z79899 Other long term (current) drug therapy: Secondary | ICD-10-CM | POA: Insufficient documentation

## 2021-01-10 NOTE — ED Triage Notes (Signed)
Pt arrived via EMS from [place]. Pt has not urinated today, and when pt was given an in-and-out catheter the output was "gunky" per [employer]. Pt has hx of dementia. Pt reports no pain or abdominal distension.

## 2021-01-11 DIAGNOSIS — R300 Dysuria: Secondary | ICD-10-CM | POA: Diagnosis not present

## 2021-01-11 LAB — URINALYSIS, ROUTINE W REFLEX MICROSCOPIC
Bacteria, UA: NONE SEEN
Bilirubin Urine: NEGATIVE
Glucose, UA: NEGATIVE mg/dL
Ketones, ur: NEGATIVE mg/dL
Leukocytes,Ua: NEGATIVE
Nitrite: NEGATIVE
Protein, ur: NEGATIVE mg/dL
Specific Gravity, Urine: 1.001 — ABNORMAL LOW (ref 1.005–1.030)
pH: 7 (ref 5.0–8.0)

## 2021-01-11 MED ORDER — PHENAZOPYRIDINE HCL 200 MG PO TABS: 200.0000 mg | ORAL_TABLET | Freq: Three times a day (TID) | ORAL | 0 refills | Status: DC

## 2021-01-11 NOTE — ED Provider Notes (Signed)
[facility]-EMERGENCY DEPT Provider Note   CSN: [id number] Arrival date & time: 01/10/21  2327   History Chief Complaint  Patient presents with  . Urinary Frequency    Faith White Faith White is a 80 y.o. female.  The history is provided by the nursing home. The history is limited by the condition of the patient (Dementia).  Urinary Frequency  She has history of dementia and was sent from the nursing home because of concern for possible urinary tract infection.  Reports states that she had not urinated today and when she was catheterized, urine looked "gunky".  Patient is complaining that she needs to urinate now but is not able to give me any other history.  Past Medical History:  Diagnosis Date  . Dementia (HCC)     There are no problems to display for this patient.   History reviewed. No pertinent surgical history.   OB History   No obstetric history on file.     History reviewed. No pertinent family history.  Social History   Tobacco Use  . Smoking status: Never Smoker  . Smokeless tobacco: Never Used  Substance Use Topics  . Alcohol use: Not Currently  . Drug use: Never    Home Medications Prior to Admission medications   Medication Sig Start Date End Date Taking? Authorizing Provider  carvedilol (COREG) 3.125 MG tablet Take 3.125 mg by mouth 2 (two) times daily with a meal. Patient not taking: Reported on 10/11/2020    [provider]  Ensure Plus (ENSURE PLUS) LIQD Take 237 mLs by mouth 2 (two) times daily between meals.    [provider]  levothyroxine (SYNTHROID) 50 MCG tablet Take 50 mcg by mouth daily before breakfast.    [provider]  lisinopril (ZESTRIL) 5 MG tablet Take 5 mg by mouth daily. Patient not taking: Reported on 10/11/2020    [provider]  melatonin 5 MG TABS Take 5 mg by mouth at bedtime. Patient not taking: Reported on 10/11/2020    [provider]  memantine  (NAMENDA) 10 MG tablet Take 10 mg by mouth 2 (two) times daily. Patient not taking: Reported on 10/11/2020    [provider]  pantoprazole (PROTONIX) 40 MG tablet Take 40 mg by mouth daily. Patient not taking: Reported on 10/11/2020    [provider]    Allergies    Patient has no known allergies.  Review of Systems   Review of Systems  Unable to perform ROS: Dementia  Genitourinary: Positive for frequency.    Physical Exam Updated Vital Signs BP (!) 143/94   Pulse 75   Temp 98.9 F (37.2 C) (Oral)   Resp 18   SpO2 99%   Physical Exam Vitals and nursing note reviewed.   80 year old female, resting comfortably and in no acute distress. Vital signs are significant for borderline elevated blood pressure. Oxygen saturation is 94%, which is normal. Head is normocephalic and atraumatic. PERRLA, EOMI. Oropharynx is clear. Neck is nontender and supple without adenopathy or JVD. Back is nontender and there is no CVA tenderness. Lungs are clear without rales, wheezes, or rhonchi. Chest is nontender. Heart has regular rate and rhythm without murmur. Abdomen is soft, flat, nontender without masses or hepatosplenomegaly and peristalsis is normoactive. Extremities have no cyanosis or edema, full range of motion is present. Skin is warm and dry without rash. Neurologic: Awake and alert, oriented to person but not place or time.  Cranial nerves are  grossly intact.  Moves all extremities equally.  ED Results / Procedures / Treatments   Labs (all labs ordered are listed, but only abnormal results are displayed) Labs Reviewed  URINALYSIS, ROUTINE W REFLEX MICROSCOPIC - Abnormal; Notable for the following components:      Result Value   Color, Urine COLORLESS (*)    Specific Gravity, Urine 1.001 (*)    Hgb urine dipstick SMALL (*)    All other components within normal limits  URINE CULTURE   Procedures Procedures   Medications Ordered in ED Medications - No data  to display  ED Course  I have reviewed the triage vital signs and the nursing notes.  Pertinent lab results that were available during my care of the patient were reviewed by me and considered in my medical decision making (see chart for details).  MDM Rules/Calculators/A&P Possible urinary retention, possible UTI.  Will get urine by straight catheterization and send for analysis.  Old records reviewed, and she has no relevant past visits.  Bladder scan only showed 260 mL in the bladder.  Urinalysis shows no evidence of infection, but was sent for culture.  She is discharged back to her skilled nursing facility with a short course of phenazopyridine.  Final Clinical Impression(s) / ED Diagnoses Final diagnoses:  Dysuria    Rx / DC Orders ED Discharge Orders    None       Dione Booze, MD 01/11/21 [phone]

## 2021-01-11 NOTE — ED Notes (Signed)
Pt assisted to [facility]

## 2021-01-11 NOTE — Discharge Instructions (Addendum)
Your evaluation did not show any sign of a urinary tract infection.  However, urine was sent for culture.  If the culture shows that she need to be on antibiotics, we will contact you.

## 2021-01-11 NOTE — ED Notes (Signed)
Call to [facility] for transport back to [facility]

## 2021-01-12 LAB — URINE CULTURE: Culture: <10000 — AB

## 2021-03-31 ENCOUNTER — Emergency Department (HOSPITAL_COMMUNITY)
Admission: EM | Admit: 2021-03-31 | Discharge: 2021-04-01 | Disposition: A | Discharge status: Home / Self Care | Payer: Medicare Other | Attending: Emergency Medicine | Admitting: Emergency Medicine

## 2021-03-31 ENCOUNTER — Emergency Department (HOSPITAL_COMMUNITY): Payer: Medicare Other

## 2021-03-31 ENCOUNTER — Other Ambulatory Visit: Payer: Self-pay

## 2021-03-31 DIAGNOSIS — W19XXXA Unspecified fall, initial encounter: Secondary | ICD-10-CM | POA: Diagnosis not present

## 2021-03-31 DIAGNOSIS — F0390 Unspecified dementia without behavioral disturbance: Secondary | ICD-10-CM | POA: Diagnosis not present

## 2021-03-31 DIAGNOSIS — N390 Urinary tract infection, site not specified: Secondary | ICD-10-CM | POA: Diagnosis not present

## 2021-03-31 DIAGNOSIS — A419 Sepsis, unspecified organism: Secondary | ICD-10-CM | POA: Diagnosis present

## 2021-03-31 LAB — CBC WITH DIFFERENTIAL/PLATELET
Abs Immature Granulocytes: 0.01 10*3/uL (ref 0.00–0.07)
Basophils Absolute: 0 10*3/uL (ref 0.0–0.1)
Basophils Relative: 0 %
Eosinophils Absolute: 0 10*3/uL (ref 0.0–0.5)
Eosinophils Relative: 0 %
HCT: 40.9 % (ref 36.0–46.0)
Hemoglobin: 13.6 g/dL (ref 12.0–15.0)
Immature Granulocytes: 0 %
Lymphocytes Relative: 19 %
Lymphs Abs: 1 10*3/uL (ref 0.7–4.0)
MCH: 30.4 pg (ref 26.0–34.0)
MCHC: 33.3 g/dL (ref 30.0–36.0)
MCV: 91.3 fL (ref 80.0–100.0)
Monocytes Absolute: 0.5 10*3/uL (ref 0.1–1.0)
Monocytes Relative: 10 %
Neutro Abs: 3.7 10*3/uL (ref 1.7–7.7)
Neutrophils Relative %: 71 %
Platelets: 286 10*3/uL (ref 150–400)
RBC: 4.48 MIL/uL (ref 3.87–5.11)
RDW: 13.3 % (ref 11.5–15.5)
WBC: 5.3 10*3/uL (ref 4.0–10.5)
nRBC: 0 % (ref 0.0–0.2)

## 2021-03-31 LAB — APTT: aPTT: 35 seconds (ref 24–36)

## 2021-03-31 LAB — URINALYSIS, ROUTINE W REFLEX MICROSCOPIC
Bilirubin Urine: NEGATIVE
Glucose, UA: NEGATIVE mg/dL
Hgb urine dipstick: NEGATIVE
Ketones, ur: NEGATIVE mg/dL
Nitrite: POSITIVE — AB
Protein, ur: 30 mg/dL — AB
Specific Gravity, Urine: 1.018 (ref 1.005–1.030)
WBC, UA: >50 WBC/hpf — ABNORMAL HIGH (ref 0–5)
pH: 5 (ref 5.0–8.0)

## 2021-03-31 LAB — COMPREHENSIVE METABOLIC PANEL
ALT: 18 U/L (ref 0–44)
AST: 30 U/L (ref 15–41)
Albumin: 3.8 g/dL (ref 3.5–5.0)
Alkaline Phosphatase: 48 U/L (ref 38–126)
Anion gap: 6 (ref 5–15)
BUN: 13 mg/dL (ref 8–23)
CO2: 30 mmol/L (ref 22–32)
Calcium: 8.8 mg/dL — ABNORMAL LOW (ref 8.9–10.3)
Chloride: 97 mmol/L — ABNORMAL LOW (ref 98–111)
Creatinine, Ser: 0.83 mg/dL (ref 0.44–1.00)
GFR, Estimated: >60 mL/min (ref >60)
Glucose, Bld: 113 mg/dL — ABNORMAL HIGH (ref 70–99)
Potassium: 3.2 mmol/L — ABNORMAL LOW (ref 3.5–5.1)
Sodium: 133 mmol/L — ABNORMAL LOW (ref 135–145)
Total Bilirubin: 0.5 mg/dL (ref 0.3–1.2)
Total Protein: 6.8 g/dL (ref 6.5–8.1)

## 2021-03-31 LAB — LACTIC ACID, PLASMA
Lactic Acid, Venous: 2.2 mmol/L (ref 0.5–1.9)
Lactic Acid, Venous: 1.6 mmol/L (ref 0.5–1.9)

## 2021-03-31 LAB — PROTIME-INR
INR: 0.9 (ref 0.8–1.2)
Prothrombin Time: 12.5 seconds (ref 11.4–15.2)

## 2021-03-31 MED ORDER — LACTATED RINGERS IV BOLUS (SEPSIS)
1000.0000 mL | Freq: Once | INTRAVENOUS | Status: AC
  Administered 2021-03-31: 1000 mL via INTRAVENOUS

## 2021-03-31 MED ORDER — CEPHALEXIN 250 MG PO CAPS: 250.0000 mg | ORAL_CAPSULE | Freq: Four times a day (QID) | ORAL | 0 refills | Status: DC

## 2021-03-31 MED ORDER — LACTATED RINGERS IV SOLN: INTRAVENOUS | Status: DC

## 2021-03-31 MED ORDER — SODIUM CHLORIDE 0.9 % IV SOLN
1.0000 g | INTRAVENOUS | Status: DC
  Administered 2021-03-31: 1 g via INTRAVENOUS
  Filled 2021-03-31: qty 10

## 2021-03-31 NOTE — ED Provider Notes (Signed)
[facility]-EMERGENCY DEPT Provider Note   CSN: [id number] Arrival date & time: 03/31/21  1454     History Chief Complaint  Patient presents with  . Fall    Faith White Nance Mccombs is a 80 y.o. female.  HPI Patient presents by EMS from her facility, where she was found on floor.  She is unable to give any history because of dementia.  Level 5 caveat-dementia    Past Medical History:  Diagnosis Date  . Dementia (HCC)     There are no problems to display for this patient.   No past surgical history on file.   OB History   No obstetric history on file.     No family history on file.  Social History   Tobacco Use  . Smoking status: Never Smoker  . Smokeless tobacco: Never Used  Substance Use Topics  . Alcohol use: Not Currently  . Drug use: Never    Home Medications Prior to Admission medications   Medication Sig Start Date End Date Taking? Authorizing Provider  acetaminophen (TYLENOL) 325 MG tablet Take 650 mg by mouth every 6 (six) hours as needed for moderate pain.    [provider]  Ensure Plus (ENSURE PLUS) LIQD Take 237 mLs by mouth 2 (two) times daily between meals.    [provider]  levothyroxine (SYNTHROID) 50 MCG tablet Take 50 mcg by mouth daily before breakfast.    [provider]  ondansetron (ZOFRAN) 4 MG tablet Take 4 mg by mouth every 8 (eight) hours as needed for nausea or vomiting. 12/20/20   [provider]  phenazopyridine (PYRIDIUM) 200 MG tablet Take 1 tablet (200 mg total) by mouth 3 (three) times daily. 01/11/21   Dione Booze, MD    Allergies    Patient has no known allergies.  Review of Systems   Review of Systems  Unable to perform ROS: Dementia    Physical Exam Updated Vital Signs BP 99/68   Pulse 66   Temp 98.1 F (36.7 C) (Oral)   Resp 17   SpO2 95%   Physical Exam Vitals and nursing note reviewed.  Constitutional:      General: She is not in acute  distress.    Appearance: She is well-developed. She is ill-appearing. She is not toxic-appearing or diaphoretic.     Comments: Elderly, frail  HENT:     Head: Normocephalic and atraumatic.     Right Ear: External ear normal.     Left Ear: External ear normal.  Eyes:     Conjunctiva/sclera: Conjunctivae normal.     Pupils: Pupils are equal, round, and reactive to light.  Neck:     Trachea: Phonation normal.  Cardiovascular:     Rate and Rhythm: Normal rate and regular rhythm.     Heart sounds: Normal heart sounds.  Pulmonary:     Effort: Pulmonary effort is normal.     Breath sounds: Normal breath sounds.  Abdominal:     General: There is no distension.     Palpations: Abdomen is soft.     Tenderness: There is no abdominal tenderness.  Musculoskeletal:        General: Normal range of motion.     Cervical back: Normal range of motion and neck supple.  Skin:    General: Skin is warm and dry.     Coloration: Skin is not jaundiced or pale.  Neurological:     Mental Status: She is alert.  Cranial Nerves: No cranial nerve deficit.     Sensory: No sensory deficit.     Motor: No abnormal muscle tone.     Coordination: Coordination normal.  Psychiatric:        Mood and Affect: Mood normal.        Behavior: Behavior normal.     ED Results / Procedures / Treatments   Labs (all labs ordered are listed, but only abnormal results are displayed) Labs Reviewed  LACTIC ACID, PLASMA - Abnormal; Notable for the following components:      Result Value   Lactic Acid, Venous 2.2 (*)    All other components within normal limits  COMPREHENSIVE METABOLIC PANEL - Abnormal; Notable for the following components:   Sodium 133 (*)    Potassium 3.2 (*)    Chloride 97 (*)    Glucose, Bld 113 (*)    Calcium 8.8 (*)    All other components within normal limits  URINALYSIS, ROUTINE W REFLEX MICROSCOPIC - Abnormal; Notable for the following components:   Color, Urine AMBER (*)    APPearance  HAZY (*)    Protein, ur 30 (*)    Nitrite POSITIVE (*)    Leukocytes,Ua LARGE (*)    WBC, UA >50 (*)    Bacteria, UA MANY (*)    Non Squamous Epithelial 0-5 (*)    All other components within normal limits  CULTURE, BLOOD (SINGLE)  URINE CULTURE  CBC WITH DIFFERENTIAL/PLATELET  PROTIME-INR  APTT  LACTIC ACID, PLASMA    EKG EKG Interpretation  Date/Time:  Friday Mar 31 2021 15:34:40 EDT Ventricular Rate:  74 PR Interval:  194 QRS Duration: 64 QT Interval:  380 QTC Calculation: 421 R Axis:   74 Text Interpretation: Normal sinus rhythm Anteroseptal infarct , age undetermined Abnormal ECG since last tracing no significant change Confirmed by Mancel Bale [phone]) on 03/31/2021 3:39:38 PM   Radiology DG Chest Port 1 View  Result Date: 03/31/2021 CLINICAL DATA:  Unwitnessed fall EXAM: PORTABLE CHEST 1 VIEW COMPARISON:  10/10/2020 FINDINGS: The heart size and mediastinal contours are within normal limits. Both lungs are clear. Aortic atherosclerosis. Pleural and parenchymal scarring at the apices. Emphysematous disease with mild bronchitic changes. IMPRESSION: No active disease.  Emphysematous disease Electronically Signed   By: Jasmine Pang M.D.   On: 03/31/2021 15:53    Procedures .Critical Care Performed by: Mancel Bale, MD Authorized by: Mancel Bale, MD   Critical care provider statement:    Critical care time (minutes):  40   Critical care start time:  03/31/2021 3:05 PM   Critical care time was exclusive of:  Separately billable procedures and treating other patients   Critical care was necessary to treat or prevent imminent or life-threatening deterioration of the following conditions:  Sepsis   Critical care was time spent personally by me on the following activities:  Blood draw for specimens, development of treatment plan with patient or surrogate, discussions with consultants, evaluation of patient's response to treatment, examination of patient, obtaining  history from patient or surrogate, ordering and performing treatments and interventions, ordering and review of laboratory studies, pulse oximetry, re-evaluation of patient's condition, review of old charts and ordering and review of radiographic studies     Medications Ordered in ED Medications  lactated ringers infusion (has no administration in time range)  lactated ringers bolus 1,000 mL (has no administration in time range)    And  lactated ringers bolus 1,000 mL (has no administration in time range)  cefTRIAXone (ROCEPHIN) 1 g in sodium chloride 0.9 % 100 mL IVPB (has no administration in time range)  lactated ringers bolus 1,000 mL (1,000 mLs Intravenous New Bag/Given 03/31/21 1603)    ED Course  I have reviewed the triage vital signs and the nursing notes.  Pertinent labs & imaging results that were available during my care of the patient were reviewed by me and considered in my medical decision making (see chart for details).  Clinical Course as of 03/31/21 1931  Fri Mar 31, 2021  1725 At this time evaluation is consistent with sepsis from a urine source.  Empiric antibiotics started.  High-volume IV bolus given. [EW]    Clinical Course User Index [EW] Mancel BaleWentz, Dysen Edmondson, MD   MDM Rules/Calculators/A&P                           Patient Vitals for the past 24 hrs:  BP Temp Temp src Pulse Resp SpO2  03/31/21 1506 99/68 98.1 F (36.7 C) Oral 66 17 95 %    7:38 PM Reevaluation with update and discussion. After initial assessment and treatment, an updated evaluation reveals she is resting comfortably.  She has not received all of her fluids yet due to IV being misplaced. Mancel BaleElliott Cameryn Schum   Medical Decision Making:  This patient is presenting for evaluation of being found on floor, which does require a range of treatment options, and is a complaint that involves a high risk of morbidity and mortality. The differential diagnoses include evolving sepsis, weakness, dehydration. I  decided to review old records, and in summary elderly female, unable give any history lives in a nursing care facility and was found on the floor.  No visualized trauma.  No apparent acute altered mental status.  I obtained additional historical information from son by telephone, at 7:35 PM.  He states his mother has frequent UTIs..  Clinical Laboratory Tests Ordered, included CBC, Metabolic panel, Urinalysis and Lactate, blood cultures. Review indicates normal except urinalysis consistent with UTI, sodium low, potassium low, chloride low, glucose high, calcium low. Radiologic Tests Ordered, included chest x-ray.  I independently Visualized: Radiograph images, which show no infiltrate or edema    Critical Interventions-clinical evaluation, laboratory testing, empiric antibiotics and fluid treatment, observation and reassessment  After These Interventions, the Patient was reevaluated and was found improved, with stable vital signs, no evidence of hemodynamic instability.  Patient with sepsis, but not severe sepsis.  Mildly elevated lactate improved with IV fluids.  CRITICAL CARE-yes Performed by: Mancel BaleElliott Loreda Silverio  Nursing Notes Reviewed/ Care Coordinated Applicable Imaging Reviewed Interpretation of Laboratory Data incorporated into ED treatment  The patient appears reasonably screened and/or stabilized for discharge and I doubt any other medical condition or other [facility] requiring further screening, evaluation, or treatment in the ED at this time prior to discharge.  Plan: Home Medications-continue routine medicines and use Tylenol for fever or pain; Home Treatments-gradual advance diet and activity; return here if the recommended treatment, does not improve the symptoms; Recommended follow up-PCP follow-up as needed and in 1 week for checkup and repeat urine sampling.     Final Clinical Impression(s) / ED Diagnoses Final diagnoses:  Sepsis, due to unspecified organism, unspecified whether  acute organ dysfunction present [facility](HCC)  Urinary tract infection without hematuria, site unspecified    Rx / DC Orders ED Discharge Orders         Ordered    cephALEXin (KEFLEX) 250 MG capsule  3  times daily        04/01/21 0555    cephALEXin (KEFLEX) 250 MG capsule  4 times daily,   Status:  Discontinued        03/31/21 2217           Mancel Bale, MD 04/03/21 1557

## 2021-03-31 NOTE — ED Notes (Signed)
[facility] called for transport.  

## 2021-03-31 NOTE — ED Notes (Signed)
RN called [place] to give report to [place], Charity fundraiser. They are aware of patient being discharged and informed about patients sepsis diagnosis with UTI and prescribed antibiotics.

## 2021-03-31 NOTE — ED Notes (Signed)
Mittens placed on patient. Patient pulled out IV, cardiac leads, blood pressure cuff.

## 2021-03-31 NOTE — ED Notes (Signed)
This nurse spoke with the pt's son, and POA, [place]. Contact info in chart.

## 2021-03-31 NOTE — Progress Notes (Signed)
ELINK monitoring for sepsis protocol. 

## 2021-03-31 NOTE — ED Triage Notes (Signed)
Resident from [facility], unwitnessed fall. Found sitting upright on the floor in her room. C/o buttocks pain. Hx dementia pt is at baseline

## 2021-03-31 NOTE — Discharge Instructions (Addendum)
Start the antibiotic prescription tomorrow morning.  Encouraged her to drink plenty of fluids.  Be careful when she gets up to avoid falling again.

## 2021-04-01 MED ORDER — CEPHALEXIN 250 MG PO CAPS: 250.0000 mg | ORAL_CAPSULE | Freq: Three times a day (TID) | ORAL | 0 refills | Status: DC

## 2021-04-01 NOTE — ED Notes (Signed)
Called [facility] and spoke to [place], she is aware of patient being discharged right now.

## 2021-04-02 LAB — URINE CULTURE: Culture: >=100000 — AB

## 2021-04-03 ENCOUNTER — Telehealth: Payer: Self-pay | Admitting: Emergency Medicine

## 2021-04-03 NOTE — Telephone Encounter (Signed)
Post ED Visit - Positive Culture Follow-up  Culture report reviewed by antimicrobial stewardship pharmacist: [facility] []  , Pharm.D. []  Enzo Bi, Pharm.D., BCPS AQ-ID []  , Pharm.D., BCPS []  Celedonio Miyamoto, Pharm.D., BCPS []  Hugo, Garvin Fila.D., BCPS, AAHIVP []  , Pharm.D., BCPS, AAHIVP []  Georgina Pillion, PharmD, BCPS []  , PharmD, BCPS []  [place], PharmD, BCPS []  [address], PharmD []  , PharmD, BCPS []  Estella Husk, PharmD  Pharmacy Team []  Lysle Pearl, PharmD []  , PharmD []  Phillips Climes, PharmD []  , Rph []  Agapito Games) , PharmD []  Verlan Friends, PharmD []  , PharmD []  Mervyn Gay, PharmD []  , PharmD []  Vinnie Level, PharmD []  [facility], PharmD []  , PharmD []  Len Childs, PharmD   Positive urine culture Treated with cephalexin, organism sensitive to the same and no further patient follow-up is required at this time.  04/03/2021, 10:53 AM

## 2021-04-05 LAB — CULTURE, BLOOD (SINGLE): Culture: NO GROWTH

## 2021-04-06 LAB — CULTURE, BLOOD (SINGLE)
Culture: NO GROWTH
Special Requests: ADEQUATE

## 2021-11-19 ENCOUNTER — Other Ambulatory Visit: Payer: Self-pay

## 2021-11-19 ENCOUNTER — Encounter (HOSPITAL_COMMUNITY): Payer: Self-pay

## 2021-11-19 ENCOUNTER — Emergency Department (HOSPITAL_COMMUNITY): Payer: Medicare Other

## 2021-11-19 ENCOUNTER — Emergency Department (HOSPITAL_COMMUNITY)
Admission: EM | Admit: 2021-11-19 | Discharge: 2021-11-20 | Disposition: A | Discharge status: Home / Self Care | Payer: Medicare Other | Attending: Emergency Medicine | Admitting: Emergency Medicine

## 2021-11-19 DIAGNOSIS — Z20822 Contact with and (suspected) exposure to covid-19: Secondary | ICD-10-CM | POA: Insufficient documentation

## 2021-11-19 DIAGNOSIS — K5900 Constipation, unspecified: Secondary | ICD-10-CM | POA: Insufficient documentation

## 2021-11-19 DIAGNOSIS — F0390 Unspecified dementia without behavioral disturbance: Secondary | ICD-10-CM | POA: Insufficient documentation

## 2021-11-19 DIAGNOSIS — F419 Anxiety disorder, unspecified: Secondary | ICD-10-CM | POA: Insufficient documentation

## 2021-11-19 DIAGNOSIS — R45851 Suicidal ideations: Secondary | ICD-10-CM | POA: Insufficient documentation

## 2021-11-19 LAB — CBC WITH DIFFERENTIAL/PLATELET
Abs Immature Granulocytes: 0.03 10*3/uL (ref 0.00–0.07)
Basophils Absolute: 0.1 10*3/uL (ref 0.0–0.1)
Basophils Relative: 1 %
Eosinophils Absolute: 0.1 10*3/uL (ref 0.0–0.5)
Eosinophils Relative: 1 %
HCT: 39.2 % (ref 36.0–46.0)
Hemoglobin: 13.5 g/dL (ref 12.0–15.0)
Immature Granulocytes: 0 %
Lymphocytes Relative: 19 %
Lymphs Abs: 1.8 10*3/uL (ref 0.7–4.0)
MCH: 31.6 pg (ref 26.0–34.0)
MCHC: 34.4 g/dL (ref 30.0–36.0)
MCV: 91.8 fL (ref 80.0–100.0)
Monocytes Absolute: 0.7 10*3/uL (ref 0.1–1.0)
Monocytes Relative: 7 %
Neutro Abs: 7 10*3/uL (ref 1.7–7.7)
Neutrophils Relative %: 72 %
Platelets: 444 10*3/uL — ABNORMAL HIGH (ref 150–400)
RBC: 4.27 MIL/uL (ref 3.87–5.11)
RDW: 12.2 % (ref 11.5–15.5)
WBC: 9.7 10*3/uL (ref 4.0–10.5)
nRBC: 0 % (ref 0.0–0.2)

## 2021-11-19 LAB — COMPREHENSIVE METABOLIC PANEL
ALT: 15 U/L (ref 0–44)
AST: 20 U/L (ref 15–41)
Albumin: 4 g/dL (ref 3.5–5.0)
Alkaline Phosphatase: 53 U/L (ref 38–126)
Anion gap: 8 (ref 5–15)
BUN: 18 mg/dL (ref 8–23)
CO2: 25 mmol/L (ref 22–32)
Calcium: 9.1 mg/dL (ref 8.9–10.3)
Chloride: 103 mmol/L (ref 98–111)
Creatinine, Ser: 0.84 mg/dL (ref 0.44–1.00)
GFR, Estimated: >60 mL/min (ref >60)
Glucose, Bld: 128 mg/dL — ABNORMAL HIGH (ref 70–99)
Potassium: 3.4 mmol/L — ABNORMAL LOW (ref 3.5–5.1)
Sodium: 136 mmol/L (ref 135–145)
Total Bilirubin: 0.7 mg/dL (ref 0.3–1.2)
Total Protein: 7 g/dL (ref 6.5–8.1)

## 2021-11-19 LAB — RESP PANEL BY RT-PCR (FLU A&B, COVID) ARPGX2
Influenza A by PCR: NEGATIVE
Influenza B by PCR: NEGATIVE
SARS Coronavirus 2 by RT PCR: NEGATIVE

## 2021-11-19 MED ORDER — SODIUM CHLORIDE 0.9 % IV BOLUS
500.0000 mL | Freq: Once | INTRAVENOUS | Status: AC
  Administered 2021-11-19: 500 mL via INTRAVENOUS

## 2021-11-19 MED ORDER — LORAZEPAM 2 MG/ML IJ SOLN
0.5000 mg | Freq: Once | INTRAMUSCULAR | Status: AC
  Administered 2021-11-19: 0.5 mg via INTRAVENOUS
  Filled 2021-11-19: qty 1

## 2021-11-19 MED ORDER — POLYETHYLENE GLYCOL 3350 17 G PO PACK: 17.0000 g | PACK | Freq: Two times a day (BID) | ORAL | 0 refills | Status: AC

## 2021-11-19 MED ORDER — KETAMINE HCL 10 MG/ML IJ SOLN: 0.1500 mg/kg | Freq: Once | INTRAMUSCULAR | Status: DC

## 2021-11-19 MED ORDER — KETAMINE HCL 50 MG/5ML IJ SOSY
15.0000 mg | PREFILLED_SYRINGE | Freq: Once | INTRAMUSCULAR | Status: AC
  Administered 2021-11-19: 15 mg via INTRAVENOUS
  Filled 2021-11-19: qty 5

## 2021-11-19 MED ORDER — SORBITOL 70 % SOLN
400.0000 mL | TOPICAL_OIL | Freq: Once | ORAL | Status: AC
  Administered 2021-11-19: 400 mL via RECTAL
  Filled 2021-11-19 (×2): qty 120

## 2021-11-19 NOTE — ED Provider Notes (Signed)
[facility]-EMERGENCY DEPT Provider Note   CSN: [id number] Arrival date & time: 11/19/21  1813     History  Chief Complaint  Patient presents with   Abdominal Pain   Constipation    Faith White is a 81 y.o. female.  HPI Patient presenting for abdominal pain and constipation for 1 week.  She has been given Colace without relief.  She resides in a nursing care facility.  She is unable to give additional history.  Level 5 caveat-dementia    Home Medications Prior to Admission medications   Medication Sig Start Date End Date Taking? Authorizing Provider  acetaminophen (TYLENOL) 325 MG tablet Take 650 mg by mouth every 6 (six) hours as needed for moderate pain.   Yes [provider]  docusate sodium (COLACE) 100 MG capsule Take 100 mg by mouth daily.   Yes [provider]  Ensure Plus (ENSURE PLUS) LIQD Take 237 mLs by mouth 2 (two) times daily between meals.   Yes [provider]  levothyroxine (SYNTHROID) 50 MCG tablet Take 50 mcg by mouth daily before breakfast.   Yes [provider]  MARINOL 2.5 MG capsule Take 2.5 mg by mouth in the morning and at bedtime. 06/28/21  Yes [provider]  ondansetron (ZOFRAN) 4 MG tablet Take 4 mg by mouth every 8 (eight) hours as needed for nausea or vomiting. 12/20/20  Yes [provider]  polyethylene glycol (MIRALAX / GLYCOLAX) 17 g packet Take 17 g by mouth 2 (two) times daily. 11/19/21  Yes Mancel Bale, MD  cephALEXin (KEFLEX) 250 MG capsule Take 1 capsule (250 mg total) by mouth 3 (three) times daily. 04/01/21   Rancour, Jeannett Senior, MD  phenazopyridine (PYRIDIUM) 200 MG tablet Take 1 tablet (200 mg total) by mouth 3 (three) times daily. 01/11/21   Dione Booze, MD      Allergies    Patient has no known allergies.    Review of Systems   Review of Systems  Unable to perform ROS: Dementia   Physical Exam Updated Vital Signs BP 105/66 (BP Location: Right  Arm)    Pulse 78    Temp 97.9 F (36.6 C) (Oral)    Resp 16    SpO2 100%  Physical Exam Vitals and nursing note reviewed.  Constitutional:      General: She is in acute distress.     Appearance: She is well-developed. She is ill-appearing. She is not toxic-appearing or diaphoretic.  HENT:     Head: Normocephalic and atraumatic.     Right Ear: External ear normal.     Left Ear: External ear normal.  Eyes:     Conjunctiva/sclera: Conjunctivae normal.     Pupils: Pupils are equal, round, and reactive to light.  Neck:     Trachea: Phonation normal.  Cardiovascular:     Rate and Rhythm: Normal rate and regular rhythm.     Heart sounds: Normal heart sounds.  Pulmonary:     Effort: Pulmonary effort is normal. No respiratory distress.     Breath sounds: Normal breath sounds. No stridor.  Abdominal:     General: There is no distension.     Palpations: Abdomen is soft. There is no mass.     Tenderness: There is no abdominal tenderness.  Genitourinary:    Comments: Normal anus.  Small amount of firm stool in rectum.  No fecal impaction.  Stool is brown in color. Musculoskeletal:        General:  Normal range of motion.     Cervical back: Normal range of motion and neck supple.  Skin:    General: Skin is warm and dry.  Neurological:     Mental Status: She is alert and oriented to person, place, and time.     Cranial Nerves: No cranial nerve deficit.     Sensory: No sensory deficit.     Motor: No abnormal muscle tone.     Coordination: Coordination normal.  Psychiatric:        Attention and Perception: She is inattentive.        Mood and Affect: Mood is anxious.        Behavior: Behavior is agitated.        Thought Content: Thought content includes suicidal ideation. Thought content includes suicidal plan.    ED Results / Procedures / Treatments   Labs (all labs ordered are listed, but only abnormal results are displayed) Labs Reviewed  CBC WITH DIFFERENTIAL/PLATELET - Abnormal;  Notable for the following components:      Result Value   Platelets 444 (*)    All other components within normal limits  COMPREHENSIVE METABOLIC PANEL - Abnormal; Notable for the following components:   Potassium 3.4 (*)    Glucose, Bld 128 (*)    All other components within normal limits  RESP PANEL BY RT-PCR (FLU A&B, COVID) [id number]    EKG None  Radiology CT Renal Stone Study  Result Date: 11/19/2021 CLINICAL DATA:  Acute lower abdominal pain. EXAM: CT ABDOMEN AND PELVIS WITHOUT CONTRAST TECHNIQUE: Multidetector CT imaging of the abdomen and pelvis was performed following the standard protocol without IV contrast. COMPARISON:  None. FINDINGS: Lower chest: No acute abnormality. Hepatobiliary: Minimal cholelithiasis is noted. No biliary dilatation is noted. The liver is unremarkable. Pancreas: Unremarkable. No pancreatic ductal dilatation or surrounding inflammatory changes. Spleen: Normal in size without focal abnormality. Adrenals/Urinary Tract: Adrenal glands are unremarkable. Kidneys are normal, without renal calculi, focal lesion, or hydronephrosis. Bladder is unremarkable. Stomach/Bowel: Moderate gastric distention is noted. There is no evidence of bowel obstruction or inflammation. Stool is noted in the rectum. Vascular/Lymphatic: Aortic atherosclerosis. No enlarged abdominal or pelvic lymph nodes. Reproductive: Status post hysterectomy. No adnexal masses. Other: No abdominal wall hernia or abnormality. No abdominopelvic ascites. Musculoskeletal: No acute or significant osseous findings. IMPRESSION: Minimal cholelithiasis. Moderate gastric distention is noted. No other significant abnormality seen in the abdomen or pelvis. Aortic Atherosclerosis (ICD10-I70.0). Electronically Signed   By: Lupita Raider M.D.   On: 11/19/2021 19:46    Procedures Procedures    Medications Ordered in ED Medications  ketamine (KETALAR) injection 0.15 mg/kg (has no administration in time range)  sodium  chloride 0.9 % bolus 500 mL (500 mLs Intravenous New Bag/Given 11/19/21 1953)  LORazepam (ATIVAN) injection 0.5 mg (0.5 mg Intravenous Given 11/19/21 1946)  sorbitol, milk of mag, mineral oil, glycerin (SMOG) enema (400 mLs Rectal Given 11/19/21 2209)  ketamine 50 mg in normal saline 5 mL (10 mg/mL) syringe (15 mg Intravenous Given 11/19/21 1949)    ED Course/ Medical Decision Making/ A&P Clinical Course as of 11/19/21 2257  Sun Nov 19, 2021  2131 Patient woke up from admission with ketamine analgesia, and now cannot tolerate the enema.  We will give a second dose of ketamine, then try to give the full enema. [EW]    Clinical Course User Index [EW] Mancel Bale, MD  Medical Decision Making   Patient Vitals for the past 24 hrs:  BP Temp Temp src Pulse Resp SpO2  11/19/21 1959 105/66 -- -- 78 16 100 %  11/19/21 1930 -- -- -- -- (!) 21 --  11/19/21 1915 -- -- -- -- (!) 23 --  11/19/21 1845 -- -- -- 98 (!) 25 100 %  11/19/21 1830 122/77 -- -- 79 (!) 24 100 %  11/19/21 1827 108/88 97.9 F (36.6 C) Oral 81 18 98 %    10:57 PM Reevaluation with update and discussion with patient, and son by telephone. After initial assessment and treatment, an updated evaluation reveals she is more comfortable now. Illness risk, persistent constipation, discussed. Mancel BaleElliott Yomaris Palecek    Medical Decision Making: Summary: Patient presents with ongoing constipation not improving with treatment using Colace at her facility.  She is extremely uncomfortable, crying on admission.  Critical Interventions-clinical evaluation, laboratory testing, radiographic imaging; to evaluate  Chief Complaint  Patient presents with   Abdominal Pain   Constipation    and assess for illness characterized as Acute, Self-Limited, Previously Undiagnosed, and Uncertain Prognosis   After These Interventions, the Patient was reevaluated and was found with constipation but no fecal impaction.  Patient required enema  for management of her discomfort.  I suspect that she can proceed with oral stool softener such as MiraLAX following that.  This patient is Presenting for Evaluation of constipation, which does require a range of treatment options, and is a complaint that involves a moderate risk of morbidity and mortality. The Differential Diagnoses include constipation, intra-abdominal abnormalities including intestinal infection, renal obstruction, perforation.  I obtained additional Historical Information from patient's son by telephone, as the patient is a poor historian.  Clinical Laboratory Tests Ordered, included CBC, Metabolic panel, and viral panel . Review indicates normal except potassium slightly low, glucose mildly elevated. Emergent testing abnormality management required for stabilization-no Radiologic Tests Ordered, included CT abdomen pelvis.  I independently Visualized: Radiograph images, which show no acute abnormalities      Treatment Complication Risk the patient has dementia Therapeutic Limits     CRITICAL CARE-no Performed by: Mancel BaleElliott Kiriana Worthington  Nursing Notes Reviewed/ Care Coordinated Applicable Imaging Reviewed Interpretation of Laboratory Data incorporated into ED treatment  The patient appears reasonably screened and/or stabilized for discharge and I doubt any other medical condition or other [facility] requiring further screening, evaluation, or treatment in the ED at this time prior to discharge.  Plan: Home Medications-continue usual medicine, use MiraLAX instead of Colace until having regular daily soft stools; Home Treatments-increase fluid and fiber in diet; return here if the recommended treatment, does not improve the symptoms; Recommended follow up-PCP follow-up 1 week and as needed              Final Clinical Impression(s) / ED Diagnoses Final diagnoses:  Constipation, unspecified constipation type    Rx / DC Orders ED Discharge Orders          Ordered     polyethylene glycol (MIRALAX / GLYCOLAX) 17 g packet  2 times daily        11/19/21 2257              Mancel BaleWentz, Hasset Chaviano, MD 11/19/21 2257

## 2021-11-19 NOTE — Discharge Instructions (Signed)
Try to increase amount of fiber in your diet.  Change to MiraLAX from Colace as a stool softener.  You can revert back to Colace after you are having daily soft stools with the MiraLAX.

## 2021-11-19 NOTE — ED Triage Notes (Signed)
Pt BIB EMS from [facility]. Pt has been experiencing constipation x7 days. Pt endorses sharp lower abdominal pain. Pt has been given docusate sodium without relief. Hx of dementia and urinary incontinence.   142/90 HR 84 SpO2 98% RA

## 2021-11-19 NOTE — ED Notes (Signed)
Patient transported to CT 

## 2021-11-19 NOTE — ED Notes (Signed)
[facility] called for pt. Transportation to facility. Could not advise on wait time.

## 2021-11-19 NOTE — ED Notes (Signed)
Called report Hakeem at [facility] to notify of pt. Returning.

## 2021-11-20 DIAGNOSIS — K5900 Constipation, unspecified: Secondary | ICD-10-CM | POA: Diagnosis not present

## 2022-06-03 IMAGING — CT CT HEAD W/O CM
3 series · 16 of 47 positions shown, 19 images · non-contrast
Comparison: Head CT dated 08/02/2018.

CLINICAL DATA: 79-year-old female with altered mental status.

EXAM:
CT HEAD WITHOUT CONTRAST
TECHNIQUE: Contiguous axial images were obtained from the base of the skull
through the vertex without intravenous contrast.

[Series 2: head wo · axial · 0.39mm/px · z∈[-135,-10]mm · 10 of 30 slices shown, 13 images]
[im 3/30  brain]
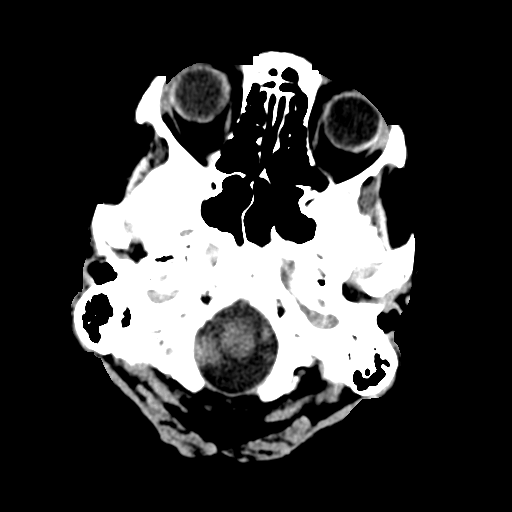
[im 3/30  bone]
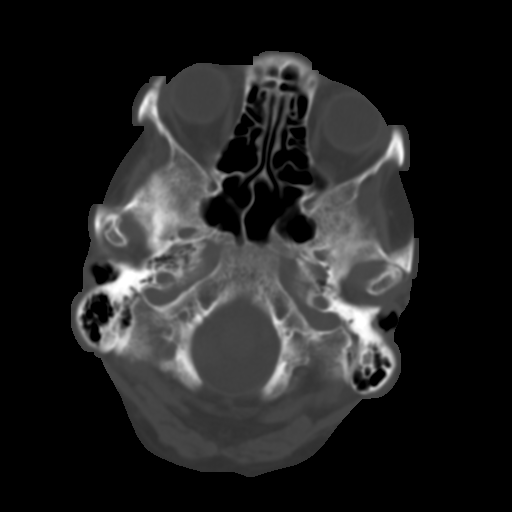
[im 6/30  brain]
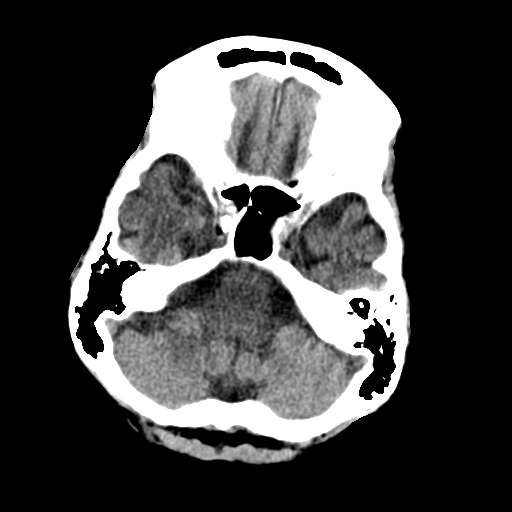
[im 9/30  brain]
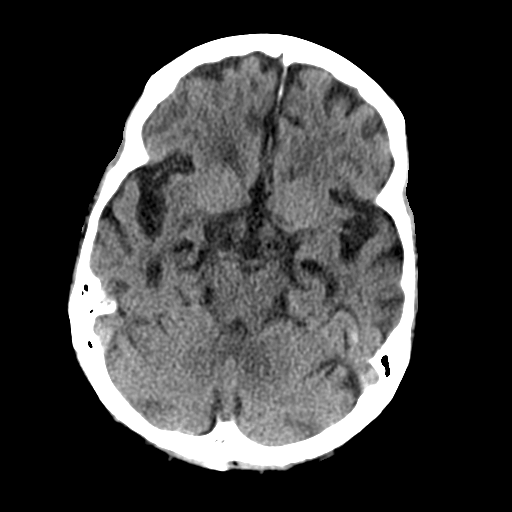
[im 11/30  brain]
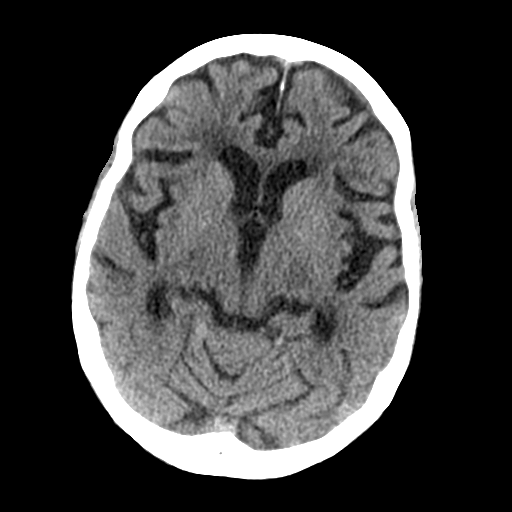
[im 14/30  brain]
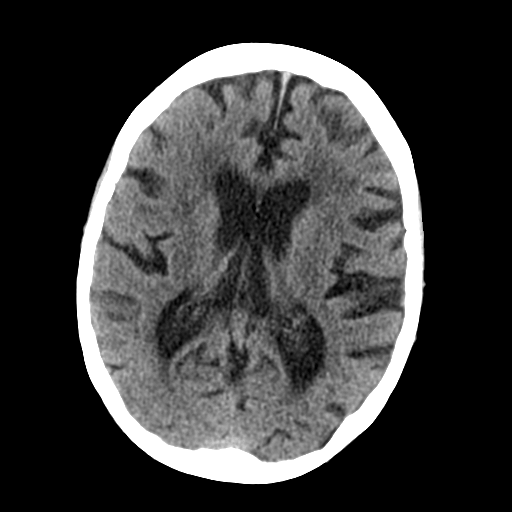
[im 14/30  bone]
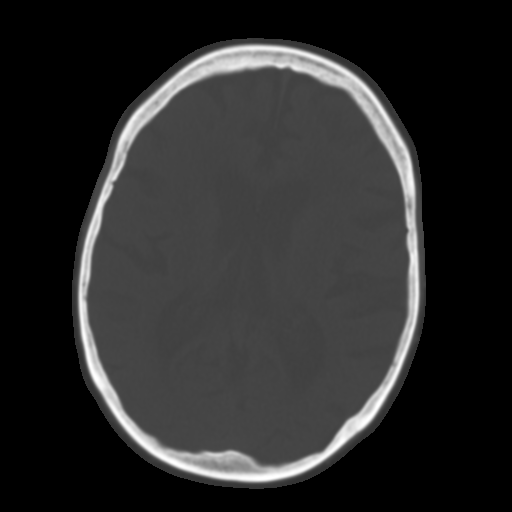
[im 17/30  brain]
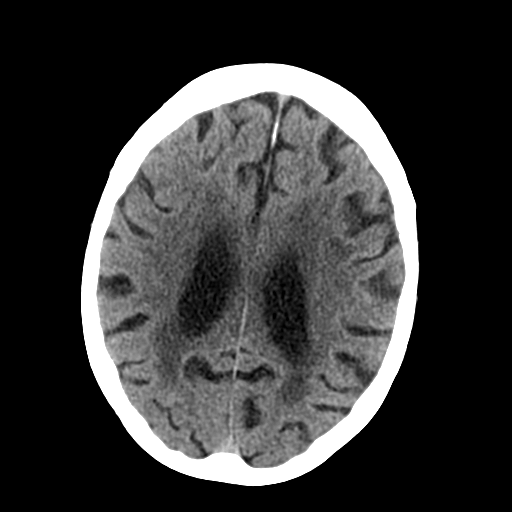
[im 20/30  brain]
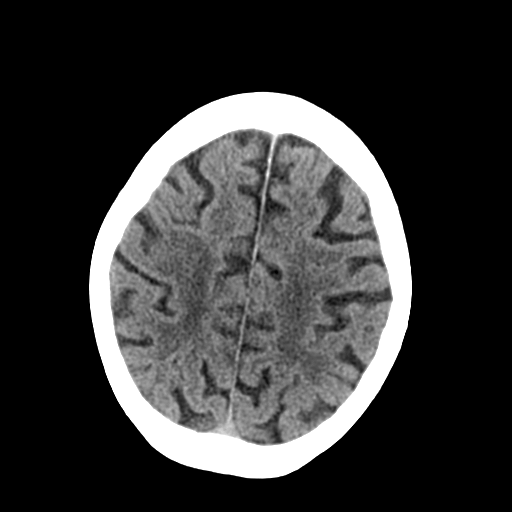
[im 23/30  brain]
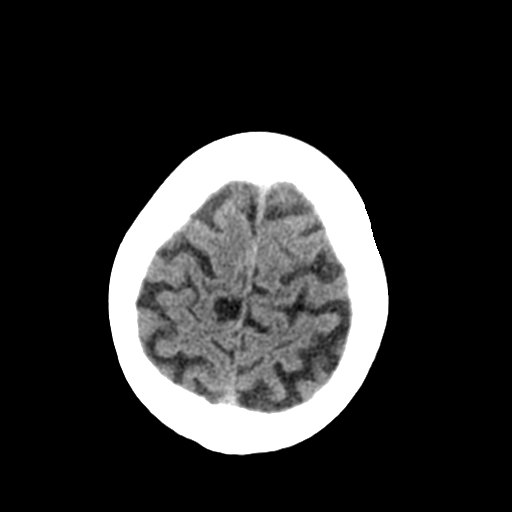
[im 25/30  brain]
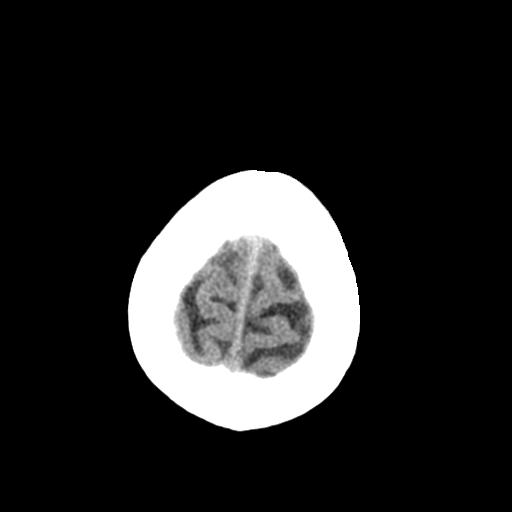
[im 25/30  bone]
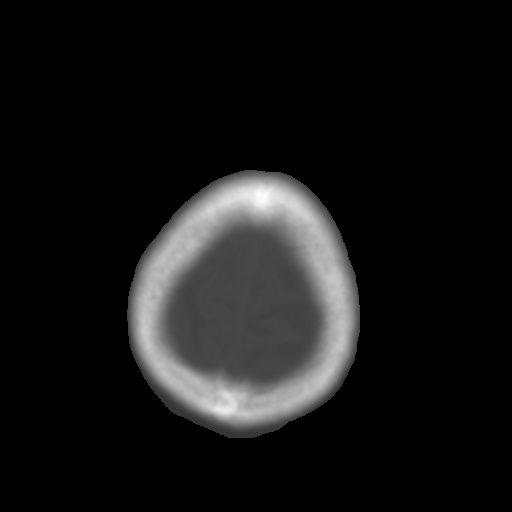
[im 28/30  brain]
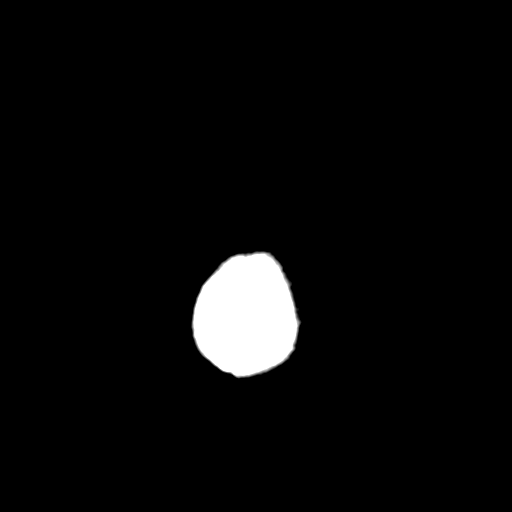

[Series 5: coronal soft tissue · coronal · 0.28mm/px · 3 of 62 slices shown]
[im 21/62  brain]
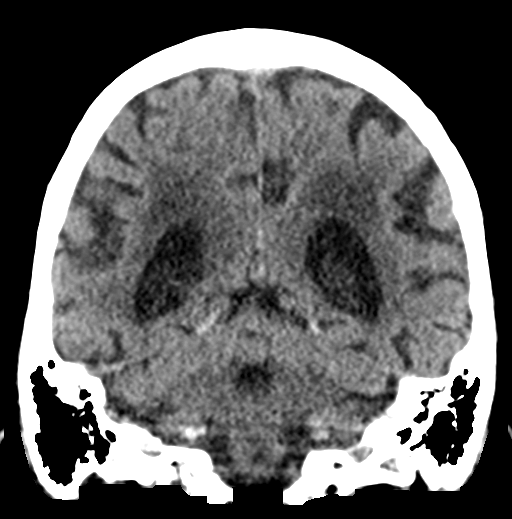
[im 28/62  brain]
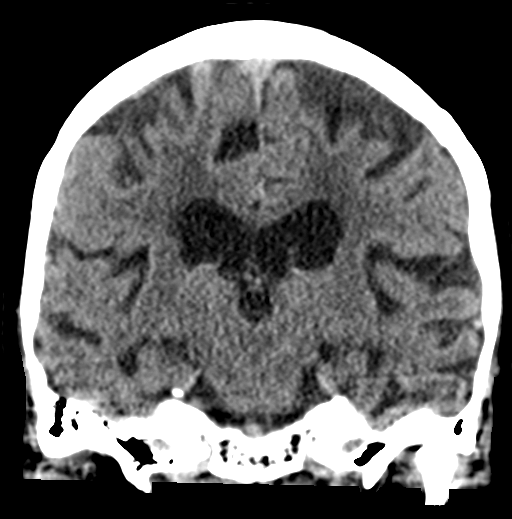
[im 34/62  brain]
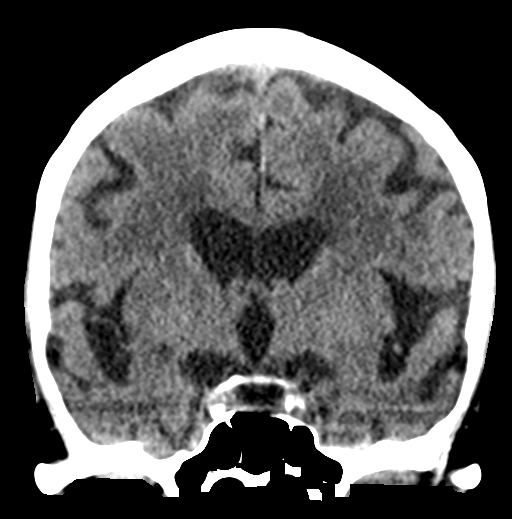

[Series 6: sagittal soft tissue · sagittal · 0.29mm/px · 3 of 49 slices shown]
[im 17/49  brain]
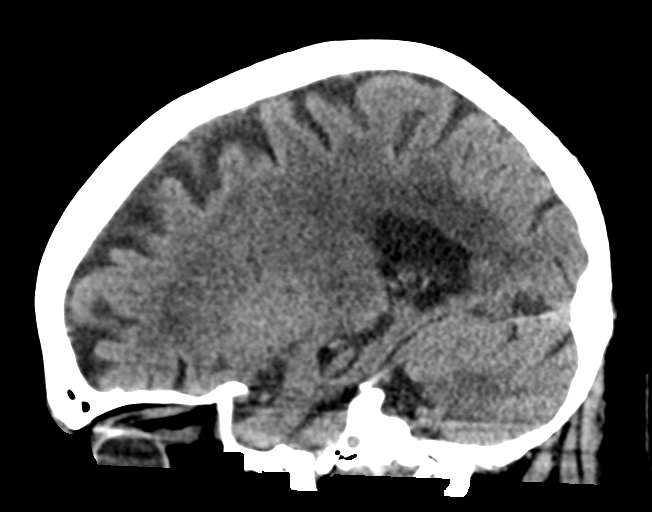
[im 25/49  brain]
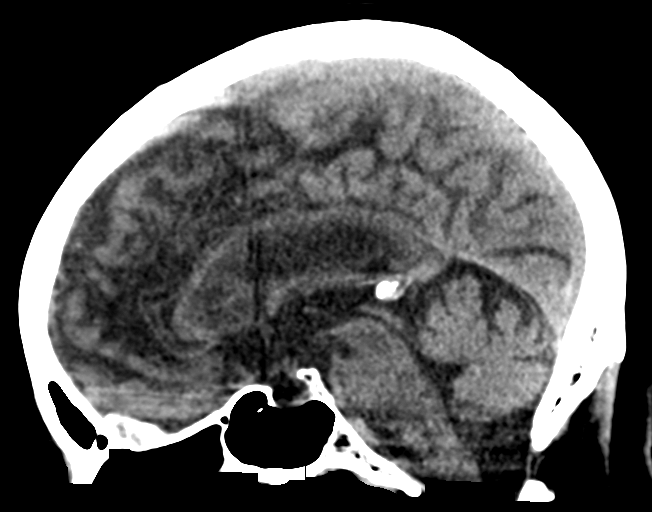
[im 33/49  brain]
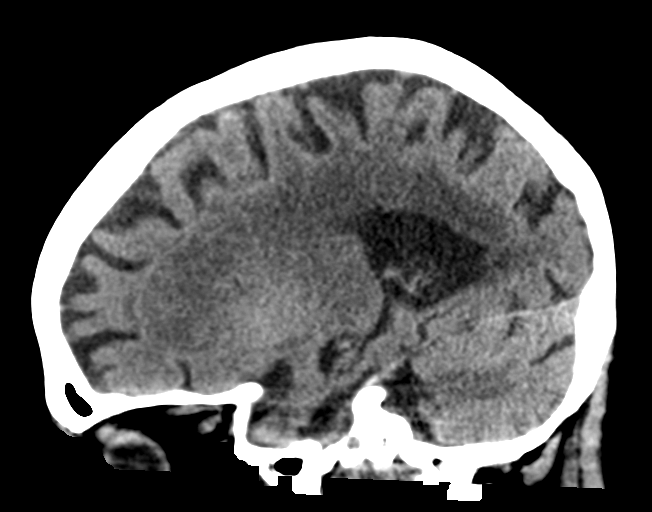

[16 of 47 positions shown; findings below may reference images not displayed]

FINDINGS: Brain: Moderate age-related atrophy and chronic microvascular
ischemic changes. There is no acute intracranial hemorrhage. No mass
effect or midline shift no extra-axial fluid collection.

Vascular: No hyperdense vessel or unexpected calcification.

Skull: Normal. Negative for fracture or focal lesion.

Sinuses/Orbits: No acute finding.

Other: None
IMPRESSION: 1. No acute intracranial pathology.
2. Moderate age-related atrophy and chronic microvascular ischemic
changes.

## 2022-11-27 IMAGING — CR DG CHEST 1V PORT
1 series · 1 of 1 positions shown · non-contrast
Comparison: 10/10/2020

CLINICAL DATA: Unwitnessed fall

EXAM:
PORTABLE CHEST 1 VIEW

[x chest ap]
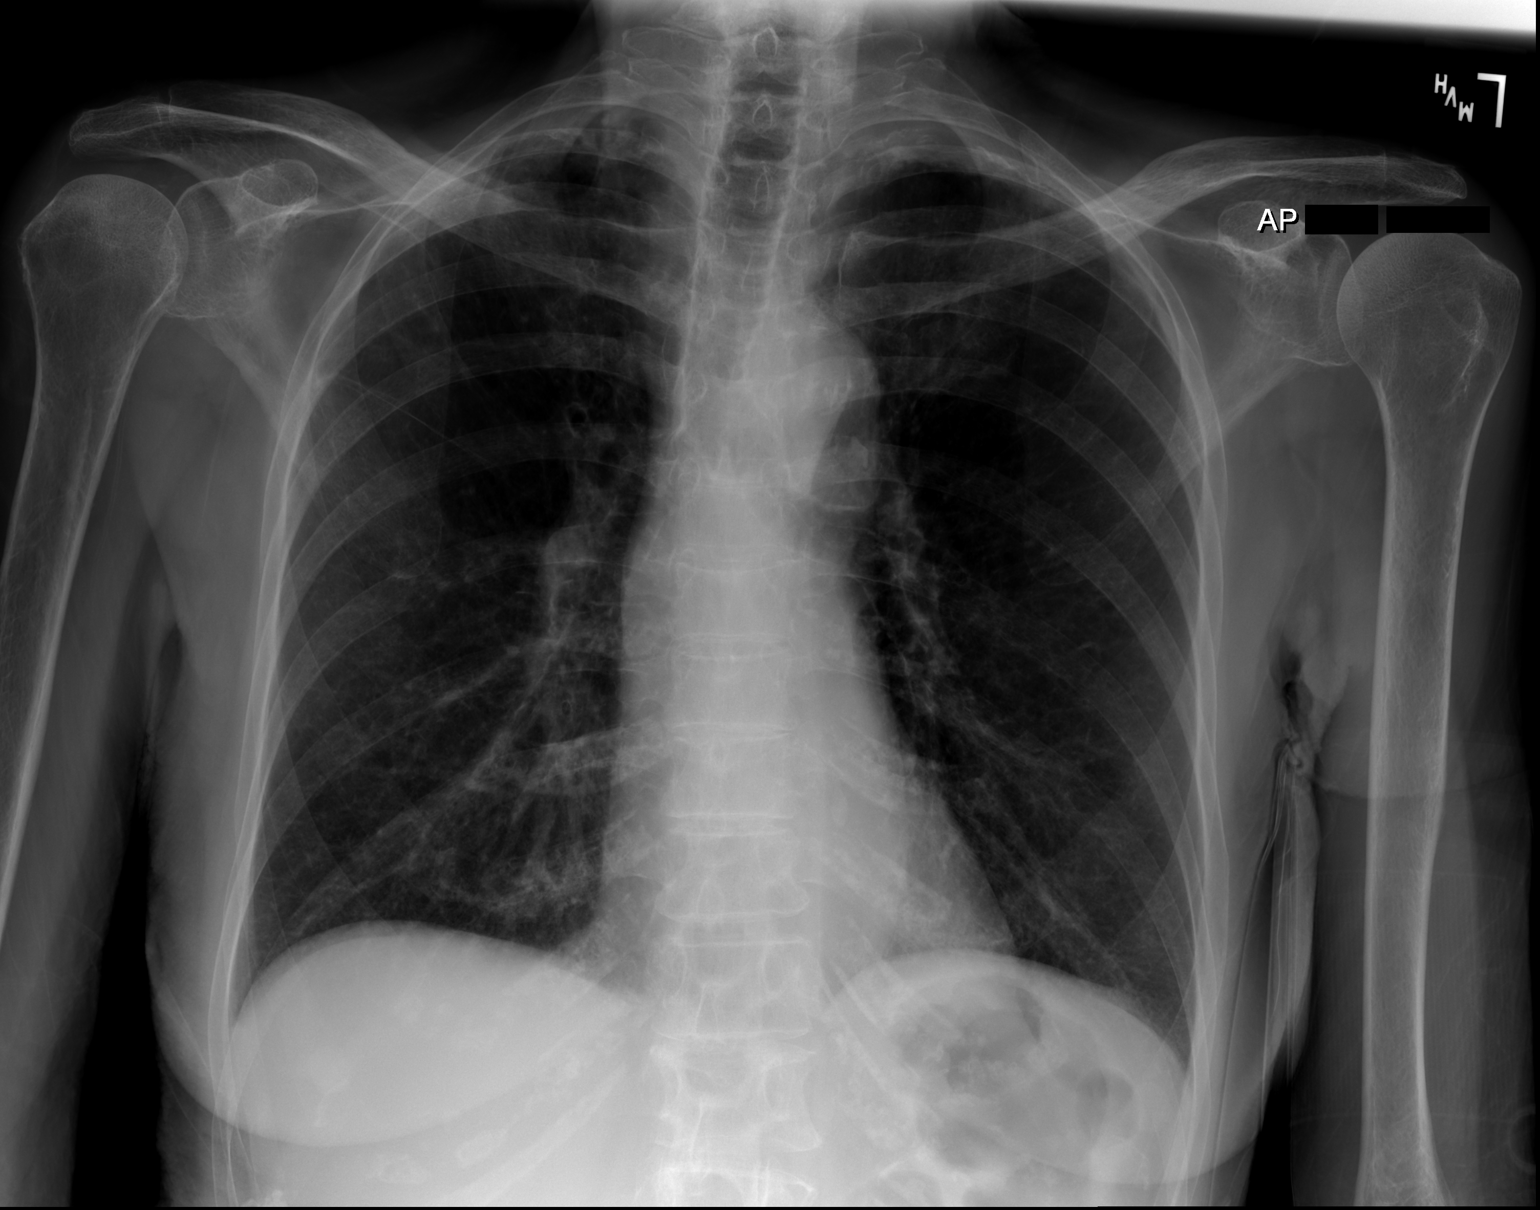

[1 of 1 positions shown; findings below may reference images not displayed]

FINDINGS: The heart size and mediastinal contours are within normal limits.
Both lungs are clear. Aortic atherosclerosis. Pleural and
parenchymal scarring at the apices. Emphysematous disease with mild
bronchitic changes.
IMPRESSION: No active disease.  Emphysematous disease
